# Patient Record
Sex: Female | Born: 1980 | Hispanic: No | Marital: Married | Smoking: Never smoker
Health system: Southern US, Community
[De-identification: ages and names within clinical notes are randomized; demographics above are authoritative.]

## PROBLEM LIST (undated history)

## (undated) DIAGNOSIS — O24419 Gestational diabetes mellitus in pregnancy, unspecified control: Secondary | ICD-10-CM

## (undated) HISTORY — DX: Gestational diabetes mellitus in pregnancy, unspecified control: O24.419

## (undated) HISTORY — PX: DILATION AND CURETTAGE OF UTERUS: SHX78

---

## 2012-07-13 ENCOUNTER — Emergency Department: Payer: Self-pay | Admitting: Emergency Medicine

## 2012-07-13 LAB — CBC
HCT: 44.5 % (ref 35.0–47.0)
HGB: 14.1 g/dL (ref 12.0–16.0)
MCH: 28.2 pg (ref 26.0–34.0)
MCHC: 31.6 g/dL — ABNORMAL LOW (ref 32.0–36.0)
MCV: 89 fL (ref 80–100)
Platelet: 241 10*3/uL (ref 150–440)
RBC: 4.99 10*6/uL (ref 3.80–5.20)
RDW: 12.9 % (ref 11.5–14.5)
WBC: 11.6 10*3/uL — ABNORMAL HIGH (ref 3.6–11.0)

## 2012-07-13 LAB — URINALYSIS, COMPLETE
Bilirubin,UR: NEGATIVE
Glucose,UR: NEGATIVE mg/dL (ref 0–75)
Ketone: NEGATIVE
Leukocyte Esterase: NEGATIVE
Nitrite: NEGATIVE
Ph: 6 (ref 4.5–8.0)
Protein: NEGATIVE
RBC,UR: <1 /HPF (ref 0–5)
Specific Gravity: 1.002 (ref 1.003–1.030)
Squamous Epithelial: <1
WBC UR: <1 /HPF (ref 0–5)

## 2012-07-13 LAB — HCG, QUANTITATIVE, PREGNANCY: Beta Hcg, Quant.: 480 m[IU]/mL — ABNORMAL HIGH

## 2013-06-15 LAB — OB RESULTS CONSOLE ANTIBODY SCREEN: Antibody Screen: NEGATIVE

## 2013-06-15 LAB — OB RESULTS CONSOLE ABO/RH: RH Type: POSITIVE

## 2013-06-15 LAB — OB RESULTS CONSOLE RUBELLA ANTIBODY, IGM: Rubella: IMMUNE

## 2013-06-15 LAB — OB RESULTS CONSOLE RPR: RPR: NONREACTIVE

## 2013-06-15 LAB — OB RESULTS CONSOLE HEPATITIS B SURFACE ANTIGEN: Hepatitis B Surface Ag: NEGATIVE

## 2013-06-15 LAB — OB RESULTS CONSOLE HIV ANTIBODY (ROUTINE TESTING): HIV: NONREACTIVE

## 2013-06-24 LAB — OB RESULTS CONSOLE GC/CHLAMYDIA
Chlamydia: NEGATIVE
Gonorrhea: NEGATIVE

## 2013-07-13 ENCOUNTER — Inpatient Hospital Stay (HOSPITAL_COMMUNITY): Admission: AD | Admit: 2013-07-13 | Payer: Self-pay | Source: Ambulatory Visit | Admitting: Obstetrics & Gynecology

## 2013-11-02 ENCOUNTER — Encounter: Payer: 59 | Attending: Obstetrics & Gynecology

## 2013-11-07 NOTE — Progress Notes (Signed)
  Patient was seen on 11/04/13 for Gestational Diabetes self-management class at the [facility]. The following learning objectives were met by the patient during this course:   States the definition of Gestational Diabetes  States why dietary management is important in controlling blood glucose  Describes the effects of carbohydrates on blood glucose levels  Demonstrates ability to create a balanced meal plan  Demonstrates carbohydrate counting   States when to check blood glucose levels  Demonstrates proper blood glucose monitoring techniques  States the effect of stress and exercise on blood glucose levels  States the importance of limiting caffeine and abstaining from alcohol and smoking  Plan:  Aim for 2 Carb Choices per meal (30 grams) +/- 1 either way for breakfast Aim for 3 Carb Choices per meal (45 grams) +/- 1 either way from lunch and dinner Aim for 1-2 Carbs per snack Begin reading food labels for Total Carbohydrate and sugar grams of foods Consider  increasing your activity level by walking daily as tolerated Begin checking BG before breakfast and 1-2 hours after first bit of breakfast, lunch and dinner after  as directed by MD  Take medication  as directed by MD  Blood glucose monitor given: Accu Chek Nano BG Monitoring Kit Lot # [id number] Exp: 11/27/13 Blood glucose reading: 82  Patient instructed to monitor glucose levels: FBS: 60 - <90 1 hour: <140 2 hour: <120  Patient received the following handouts:  Nutrition Diabetes and Pregnancy  Carbohydrate Counting List  Meal Planning worksheet  Patient will be seen for follow-up as needed.

## 2013-12-26 LAB — OB RESULTS CONSOLE GBS: GBS: NEGATIVE

## 2013-12-29 NOTE — L&D Delivery Note (Signed)
Delivery Note At 1:29 PM a viable and healthy female was delivered via Vaginal, Spontaneous Delivery (Presentation: Left Occiput Anterior).  APGAR: 9, 9; weight .pending Placenta status: Intact, Spontaneous.  Cord: 3 vessels with the following complications: None.  Cord pH: none  Anesthesia: Epidural  Episiotomy: None Lacerations: 2nd degree Suture Repair: 3.0 vicryl rapide Est. Blood Loss (mL): 400 cc  Mom to postpartum.  Baby to Couplet care / Skin to Skin.  MODY,Teresa Salinas 01/02/2014, 1:52 PM

## 2014-01-02 ENCOUNTER — Inpatient Hospital Stay (HOSPITAL_COMMUNITY)
Admission: AD | Admit: 2014-01-02 | Discharge: 2014-01-04 | DRG: 775 | Disposition: A | Discharge status: Home / Self Care | Payer: 59 | Source: Ambulatory Visit | Attending: Obstetrics & Gynecology | Admitting: Obstetrics & Gynecology

## 2014-01-02 ENCOUNTER — Inpatient Hospital Stay (HOSPITAL_COMMUNITY): Payer: 59 | Admitting: Anesthesiology

## 2014-01-02 ENCOUNTER — Encounter (HOSPITAL_COMMUNITY): Payer: Self-pay | Admitting: *Deleted

## 2014-01-02 ENCOUNTER — Encounter (HOSPITAL_COMMUNITY): Payer: 59 | Admitting: Anesthesiology

## 2014-01-02 DIAGNOSIS — O24419 Gestational diabetes mellitus in pregnancy, unspecified control: Secondary | ICD-10-CM

## 2014-01-02 DIAGNOSIS — O99814 Abnormal glucose complicating childbirth: Principal | ICD-10-CM | POA: Diagnosis present

## 2014-01-02 HISTORY — DX: Gestational diabetes mellitus in pregnancy, unspecified control: O24.419

## 2014-01-02 LAB — CBC
HCT: 40.2 % (ref 36.0–46.0)
Hemoglobin: 13.6 g/dL (ref 12.0–15.0)
MCH: 27.4 pg (ref 26.0–34.0)
MCHC: 33.8 g/dL (ref 30.0–36.0)
MCV: 81 fL (ref 78.0–100.0)
Platelets: 187 10*3/uL (ref 150–400)
RBC: 4.96 MIL/uL (ref 3.87–5.11)
RDW: 14.9 % (ref 11.5–15.5)
WBC: 9.9 10*3/uL (ref 4.0–10.5)

## 2014-01-02 LAB — GLUCOSE, CAPILLARY: Glucose-Capillary: 76 mg/dL (ref 70–99)

## 2014-01-02 LAB — POCT FERN TEST
POCT Fern Test: NEGATIVE
POCT Fern Test: NEGATIVE

## 2014-01-02 LAB — RPR: RPR Ser Ql: NONREACTIVE

## 2014-01-02 LAB — AMNISURE RUPTURE OF MEMBRANE (ROM) NOT AT ARMC: Amnisure ROM: POSITIVE

## 2014-01-02 MED ORDER — ONDANSETRON HCL 4 MG/2ML IJ SOLN: 4.0000 mg | Freq: Four times a day (QID) | INTRAMUSCULAR | Status: DC | PRN

## 2014-01-02 MED ORDER — LACTATED RINGERS IV SOLN
INTRAVENOUS | Status: DC
  Administered 2014-01-02: 10:00:00 via INTRAVENOUS

## 2014-01-02 MED ORDER — PHENYLEPHRINE 40 MCG/ML (10ML) SYRINGE FOR IV PUSH (FOR BLOOD PRESSURE SUPPORT)
80.0000 ug | PREFILLED_SYRINGE | INTRAVENOUS | Status: DC | PRN
  Filled 2014-01-02: qty 2
  Filled 2014-01-02: qty 10

## 2014-01-02 MED ORDER — OXYTOCIN BOLUS FROM INFUSION: 500.0000 mL | INTRAVENOUS | Status: DC

## 2014-01-02 MED ORDER — OXYCODONE-ACETAMINOPHEN 5-325 MG PO TABS: 1.0000 | ORAL_TABLET | ORAL | Status: DC | PRN

## 2014-01-02 MED ORDER — DIBUCAINE 1 % RE OINT
1.0000 "application " | TOPICAL_OINTMENT | RECTAL | Status: DC | PRN
  Administered 2014-01-02: 1 via RECTAL
  Filled 2014-01-02: qty 28

## 2014-01-02 MED ORDER — DIPHENHYDRAMINE HCL 25 MG PO CAPS: 25.0000 mg | ORAL_CAPSULE | Freq: Four times a day (QID) | ORAL | Status: DC | PRN

## 2014-01-02 MED ORDER — BENZOCAINE-MENTHOL 20-0.5 % EX AERO
1.0000 "application " | INHALATION_SPRAY | CUTANEOUS | Status: DC | PRN
  Administered 2014-01-02: 1 via TOPICAL
  Filled 2014-01-02: qty 56

## 2014-01-02 MED ORDER — IBUPROFEN 600 MG PO TABS: 600.0000 mg | ORAL_TABLET | Freq: Four times a day (QID) | ORAL | Status: DC | PRN

## 2014-01-02 MED ORDER — EPHEDRINE 5 MG/ML INJ
10.0000 mg | INTRAVENOUS | Status: DC | PRN
  Filled 2014-01-02: qty 2
  Filled 2014-01-02: qty 4

## 2014-01-02 MED ORDER — ZOLPIDEM TARTRATE 5 MG PO TABS: 5.0000 mg | ORAL_TABLET | Freq: Every evening | ORAL | Status: DC | PRN

## 2014-01-02 MED ORDER — LIDOCAINE HCL (PF) 1 % IJ SOLN
30.0000 mL | INTRAMUSCULAR | Status: DC | PRN
  Filled 2014-01-02 (×2): qty 30

## 2014-01-02 MED ORDER — EPHEDRINE 5 MG/ML INJ
10.0000 mg | INTRAVENOUS | Status: DC | PRN
  Filled 2014-01-02: qty 2

## 2014-01-02 MED ORDER — ONDANSETRON HCL 4 MG/2ML IJ SOLN: 4.0000 mg | INTRAMUSCULAR | Status: DC | PRN

## 2014-01-02 MED ORDER — SENNOSIDES-DOCUSATE SODIUM 8.6-50 MG PO TABS
2.0000 | ORAL_TABLET | ORAL | Status: DC
  Administered 2014-01-03: 2 via ORAL
  Filled 2014-01-02 (×2): qty 2

## 2014-01-02 MED ORDER — TERBUTALINE SULFATE 1 MG/ML IJ SOLN: 0.2500 mg | Freq: Once | INTRAMUSCULAR | Status: DC | PRN

## 2014-01-02 MED ORDER — DIPHENHYDRAMINE HCL 50 MG/ML IJ SOLN: 12.5000 mg | INTRAMUSCULAR | Status: DC | PRN

## 2014-01-02 MED ORDER — ACETAMINOPHEN 325 MG PO TABS: 650.0000 mg | ORAL_TABLET | ORAL | Status: DC | PRN

## 2014-01-02 MED ORDER — CITRIC ACID-SODIUM CITRATE 334-500 MG/5ML PO SOLN: 30.0000 mL | ORAL | Status: DC | PRN

## 2014-01-02 MED ORDER — OXYTOCIN 40 UNITS IN LACTATED RINGERS INFUSION - SIMPLE MED
62.5000 mL/h | INTRAVENOUS | Status: DC
  Administered 2014-01-02: 62.5 mL/h via INTRAVENOUS

## 2014-01-02 MED ORDER — LACTATED RINGERS IV SOLN: 500.0000 mL | INTRAVENOUS | Status: DC | PRN

## 2014-01-02 MED ORDER — BUTORPHANOL TARTRATE 1 MG/ML IJ SOLN: 1.0000 mg | INTRAMUSCULAR | Status: DC | PRN

## 2014-01-02 MED ORDER — LANOLIN HYDROUS EX OINT: TOPICAL_OINTMENT | CUTANEOUS | Status: DC | PRN

## 2014-01-02 MED ORDER — IBUPROFEN 600 MG PO TABS
600.0000 mg | ORAL_TABLET | Freq: Four times a day (QID) | ORAL | Status: DC
  Administered 2014-01-02 – 2014-01-04 (×7): 600 mg via ORAL
  Filled 2014-01-02 (×8): qty 1

## 2014-01-02 MED ORDER — FLEET ENEMA 7-19 GM/118ML RE ENEM: 1.0000 | ENEMA | RECTAL | Status: DC | PRN

## 2014-01-02 MED ORDER — PRENATAL MULTIVITAMIN CH
1.0000 | ORAL_TABLET | Freq: Every day | ORAL | Status: DC
  Administered 2014-01-03 – 2014-01-04 (×2): 1 via ORAL
  Filled 2014-01-02 (×2): qty 1

## 2014-01-02 MED ORDER — LACTATED RINGERS IV SOLN: 500.0000 mL | Freq: Once | INTRAVENOUS | Status: DC

## 2014-01-02 MED ORDER — FENTANYL 2.5 MCG/ML BUPIVACAINE 1/10 % EPIDURAL INFUSION (WH - ANES)
14.0000 mL/h | INTRAMUSCULAR | Status: DC | PRN
  Filled 2014-01-02: qty 125

## 2014-01-02 MED ORDER — FENTANYL 2.5 MCG/ML BUPIVACAINE 1/10 % EPIDURAL INFUSION (WH - ANES)
INTRAMUSCULAR | Status: DC | PRN
  Administered 2014-01-02: 14 mL/h via EPIDURAL

## 2014-01-02 MED ORDER — ONDANSETRON HCL 4 MG PO TABS: 4.0000 mg | ORAL_TABLET | ORAL | Status: DC | PRN

## 2014-01-02 MED ORDER — OXYTOCIN 40 UNITS IN LACTATED RINGERS INFUSION - SIMPLE MED
1.0000 m[IU]/min | INTRAVENOUS | Status: DC
  Administered 2014-01-02: 2 m[IU]/min via INTRAVENOUS
  Filled 2014-01-02: qty 1000

## 2014-01-02 MED ORDER — LIDOCAINE HCL (PF) 1 % IJ SOLN
INTRAMUSCULAR | Status: DC | PRN
  Administered 2014-01-02 (×2): 9 mL

## 2014-01-02 MED ORDER — TETANUS-DIPHTH-ACELL PERTUSSIS 5-2.5-18.5 LF-MCG/0.5 IM SUSP: 0.5000 mL | Freq: Once | INTRAMUSCULAR | Status: DC

## 2014-01-02 MED ORDER — PHENYLEPHRINE 40 MCG/ML (10ML) SYRINGE FOR IV PUSH (FOR BLOOD PRESSURE SUPPORT)
80.0000 ug | PREFILLED_SYRINGE | INTRAVENOUS | Status: DC | PRN
  Filled 2014-01-02: qty 2

## 2014-01-02 MED ORDER — SIMETHICONE 80 MG PO CHEW: 80.0000 mg | CHEWABLE_TABLET | ORAL | Status: DC | PRN

## 2014-01-02 MED ORDER — WITCH HAZEL-GLYCERIN EX PADS: 1.0000 "application " | MEDICATED_PAD | CUTANEOUS | Status: DC | PRN

## 2014-01-02 NOTE — Anesthesia Postprocedure Evaluation (Signed)
Anesthesia Post Note  Patient: [name] [name]   Procedure(s) Performed: CLE/C/S  Anesthesia type: Epidural  Patient location: Mother/Baby  Post pain: Pain level controlled  Post assessment: Post-op Vital signs reviewed  Last Vitals: BP 117/68  Pulse 104  Temp(Src) 36.8 C (Oral)  Resp 20  Ht 5\' 4"  (1.626 m)  Wt 167 lb 12.8 oz (76.114 kg)  BMI 28.79 kg/m2  SpO2 97%  Post vital signs: Reviewed  Level of consciousness: awake  Complications: No apparent anesthesia complications

## 2014-01-02 NOTE — MAU Note (Signed)
Pt reports leaking clear fluid since 0430.

## 2014-01-02 NOTE — H&P (Signed)
Teresa Salinas is a 33 y.o. female presenting at 37.2 wks with leaking clear fluid since 4 am and uterine contractions. Amnisure (+). Good FMs, no bleeding.   [facility], primary Dr Teresa Salinas. 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  A2GDM with good control with Glyburide 2.5 mg with breakfast. Fetal growth AGA with 34 wks sono at 5 lbs, 43%, nl AC and AFI, Vtx. Wkly ANtesting from 34 wks with reactive NSTs. GBS(-) per office chart (12/28/13).   Maternal Medical History:  Reason for admission: Rupture of membranes.     OB History   Grav Para Term Preterm Abortions TAB SAB Ect Mult Living   2 0   1  1        Past Medical History  Diagnosis Date  . Gestational diabetes mellitus, antepartum    Past Surgical History  Procedure Laterality Date  . Dilation and curettage of uterus     Family History: family history is not on file. Social History:  reports that she has never smoked. She has never used smokeless tobacco. She reports that she does not drink alcohol or use illicit drugs.   Prenatal Transfer Tool  Maternal Diabetes: Yes:  Diabetes Type:  Insulin/Medication controlled- Glyburide 2.5 mg daily AM Genetic Screening: Normal Ultrascreen neg, AFP1 normal Maternal Ultrasounds/Referrals: Normal Fetal Ultrasounds or other Referrals:  None Maternal Substance Abuse:  No Significant Maternal Medications:  Meds include: Other:  Prometrium and ASA in 1st trim. Glyburide from 3rd trimester, 2.5mg  AM. Significant Maternal Lab Results:  Lab values include: Group B Strep negative Other Comments:  None  ROS  Dilation: 2.5 Effacement (%): 70 Station: -1 Exam by:: Teresa Simpson RN Blood pressure 117/76, pulse 90, temperature 98 F (36.7 C), temperature source Oral, resp. rate 18, height 5\' 4"  (1.626 m), weight 167 lb 12.8 oz (76.114 kg). Exam Physical Exam  A&O x 3, no acute distress. Pleasant HEENT  neg Lungs CTA bilat CV RRR, S1S2 normal Abdo soft, non tender, non acute Extr no edema/ tenderness Pelvic as above FHT  140s/ + accels, no decels, mod variab- category I Toco q 4-6 min  Prenatal labs: ABO, Rh: B/Positive/-- (06/18 0000) Antibody: Negative (06/18 0000) Rubella: Immune (06/18 0000) RPR: Nonreactive (06/18 0000)  HBsAg: Negative (06/18 0000)  HIV: Non-reactive (06/18 0000)  GBS:   Negative  GLucola abn- has GDM Anatomy and growth sono normal  Assessment/Plan: 33 yo G2P0010 at 37.2 wks with A2-GDM on Glyburide with ruptured membranes since 4 am and GBS neg. Vtx. Admit, augment as needed with pitocin, pain meds/epidural reviewed. FHT category I.   MODY,Teresa Salinas 01/02/2014, 7:29 AM

## 2014-01-02 NOTE — Progress Notes (Signed)
Teresa Salinas is a 33 y.o. G2P0010 at 2355w2d, feels some rectal pressure.  Objective: BP 124/74  Pulse 97  Temp(Src) 98.2 F (36.8 C) (Oral)  Resp 18  Ht 5\' 4"  (1.626 m)  Wt 167 lb 12.8 oz (76.114 kg)  BMI 28.79 kg/m2  SpO2 100%   Total I/O In: -  Out: 400 [Urine:400]  FHT:  FHR: 120 bpm, variability: minimal ,  accelerations:  Present,  decelerations:  Absent UC:   Every 3 min SVE:   Dilation: 10 Effacement (%): 70 Station: +2 Exam by:: Teresa Salinas  Assessment / Plan: Augmentation of labor, progressing well  Labor: Progressing normally Preeclampsia:  none Fetal Wellbeing:  Category I Pain Control:  Epidural I/D:  n/a Anticipated MOD:  NSVD  Teresa Salinas,Teresa Salinas 01/02/2014, 1:09 PM

## 2014-01-02 NOTE — Lactation Note (Signed)
This note was copied from the chart of Teresa Zakyria Pantoja. Lactation Consultation Note  Patient Name: Teresa Janace HoardSumalatha Hosmer ZOXWR'UToday's Date: 01/02/2014 Reason for consult: Initial assessment;Difficult latch;Infant < 6lbs;Late preterm infant.  RN, Vernona RiegerLaura had reported a difficult latch with this primipara and her preterm (37 week) newborn at 88 hours of age.  LC arrived to find baby asleep in crib and with some saliva and mucus inside her mouth.  Mom states she nursed baby about 4 hours ago and her nurse had reported some hand expression of small amounts of colostrum earlier today.  At this attempt, LC suggested sitting football position and baby was briefly responsive to gentle massage but quickly fell asleep and did not latch, even when #20 NS was tried.  Mom has very small/short nipples which could make it difficult for baby to obtain a deep latch.  Use of NS reviewed with parents and STS and cue feedings at least every 3 hours recommended due to baby's small size and borderline premie status.  LC encouraged review of Baby and Me pp 9, 14 and 20-25 for STS and BF information. LC provided [facility] Resource brochure and reviewed [facility] services and list of community and web site resources.  Report of LC latch attempt given to night shift RN, Sheralyn Boatmanoni.    Maternal Data Formula Feeding for Exclusion: No Infant to breast within first hour of birth: Yes (initial LATCH score=7 and nursed for 20 minutes) Has patient been taught Hand Expression?: Yes (RN and LC both assisted and demonstrated hand expression; colostrum easily expressed) Does the patient have breastfeeding experience prior to this delivery?: No  Feeding Feeding Type: Breast Fed Length of feed: 60 min  LATCH Score/Interventions Latch: Too sleepy or reluctant, no latch achieved, no sucking elicited. Intervention(s): Skin to skin;Teach feeding cues;Waking techniques (demonstrated gentle massage and baby responsive but fell asleep)  Audible Swallowing:  None Intervention(s): Skin to skin;Hand expression  Type of Nipple: Everted at rest and after stimulation (nipples small and very short and baby reluctant to open mouth wide)  Comfort (Breast/Nipple): Soft / non-tender     Hold (Positioning): Assistance needed to correctly position infant at breast and maintain latch. Intervention(s): Breastfeeding basics reviewed;Support Pillows;Position options;Skin to skin  LATCH Score: 5 (too sleepy to open mouth wide and latch)  Lactation Tools Discussed/Used Tools: Nipple Shields Nipple shield size: 20 (tried without success)   Consult Status Consult Status: Follow-up Date: 01/03/14 Follow-up type: In-patient    Warrick ParisianBryant, Joanne [facility] 01/02/2014, 10:15 PM

## 2014-01-02 NOTE — Anesthesia Procedure Notes (Signed)
Epidural Patient location during procedure: OB Start time: 01/02/2014 8:36 AM End time: 01/02/2014 8:40 AM  Staffing Anesthesiologist: Leilani AbleHATCHETT, FRANKLIN Performed by: anesthesiologist   Preanesthetic Checklist Completed: patient identified, surgical consent, pre-op evaluation, timeout performed, IV checked, risks and benefits discussed and monitors and equipment checked  Epidural Patient position: sitting Prep: site prepped and draped and DuraPrep Patient monitoring: continuous pulse ox and blood pressure Approach: midline Injection technique: LOR air  Needle:  Needle type: Tuohy  Needle gauge: 17 G Needle length: 9 cm and 9 Needle insertion depth: 6 cm Catheter type: closed end flexible Catheter size: 19 Gauge Catheter at skin depth: 11 cm Test dose: negative and Other  Assessment Sensory level: T9 Events: blood not aspirated, injection not painful, no injection resistance, negative IV test and no paresthesia  Additional Notes Reason for block:procedure for pain

## 2014-01-02 NOTE — Anesthesia Preprocedure Evaluation (Signed)
Anesthesia Evaluation  Patient identified by MRN, date of birth, ID band Patient awake    Reviewed: Allergy & Precautions, H&P , NPO status , Patient's Chart, lab work & pertinent test results  Airway Mallampati: II TM Distance: >3 FB Neck ROM: full    Dental no notable dental hx.    Pulmonary neg pulmonary ROS,  breath sounds clear to auscultation  Pulmonary exam normal       Cardiovascular negative cardio ROS      Neuro/Psych negative neurological ROS  negative psych ROS   GI/Hepatic negative GI ROS, Neg liver ROS,   Endo/Other  diabetes, Gestational, Oral Hypoglycemic Agents  Renal/GU negative Renal ROS  negative genitourinary   Musculoskeletal negative musculoskeletal ROS (+)   Abdominal Normal abdominal exam  (+)   Peds  Hematology negative hematology ROS (+)   Anesthesia Other Findings   Reproductive/Obstetrics (+) Pregnancy                           Anesthesia Physical Anesthesia Plan  ASA: II  Anesthesia Plan: Epidural   Post-op Pain Management:    Induction:   Airway Management Planned:   Additional Equipment:   Intra-op Plan:   Post-operative Plan:   Informed Consent: I have reviewed the patients History and Physical, chart, labs and discussed the procedure including the risks, benefits and alternatives for the proposed anesthesia with the patient or authorized representative who has indicated his/her understanding and acceptance.     Plan Discussed with:   Anesthesia Plan Comments:         Anesthesia Quick Evaluation

## 2014-01-02 NOTE — Progress Notes (Signed)
Teresa Salinas is a 33 y.o. G2P0010 at 5747w2d by 1st trim sono. Admitted with SROM, progressed well with pitocin after epidural. No complaints.  Objective: BP 113/73  Pulse 78  Temp(Src) 98 F (36.7 C) (Oral)  Resp 18  Ht 5\' 4"  (1.626 m)  Wt 167 lb 12.8 oz (76.114 kg)  BMI 28.79 kg/m2  SpO2 100%   Total I/O In: -  Out: 400 [Urine:400]  FHT:  FHR: 135 bpm, variability: moderate,  accelerations:  Present,  decelerations:  Absent UC:   regular, every 3 minutes SVE:   Dilation: Lip/rim Effacement (%): 70 Station: 0 Exam by:: Teresa Salinas Turn in exaggerated Sims  Assessment / Plan: Augmentation of labor, progressing well  Labor: Progressing normally Fetal Wellbeing:  Category I Pain Control:  Epidural Anticipated MOD:  NSVD, EFW 5.1/2 - 6 lbs. OP, turn patient to favor rotation  Teresa Salinas,Teresa Salinas 01/02/2014, 12:18 PM

## 2014-01-03 ENCOUNTER — Encounter (HOSPITAL_COMMUNITY): Payer: Self-pay | Admitting: *Deleted

## 2014-01-03 LAB — CBC
HCT: 36 % (ref 36.0–46.0)
Hemoglobin: 11.9 g/dL — ABNORMAL LOW (ref 12.0–15.0)
MCH: 26.9 pg (ref 26.0–34.0)
MCHC: 33.1 g/dL (ref 30.0–36.0)
MCV: 81.4 fL (ref 78.0–100.0)
Platelets: 187 10*3/uL (ref 150–400)
RBC: 4.42 MIL/uL (ref 3.87–5.11)
RDW: 15.1 % (ref 11.5–15.5)
WBC: 13 10*3/uL — ABNORMAL HIGH (ref 4.0–10.5)

## 2014-01-03 NOTE — Progress Notes (Signed)
Patient ID: Janace HoardSumalatha Patchin, female   DOB: 09/25/1981, 33 y.o.   MRN: [id number] PPD # 1 SVD  S:  Reports feeling well             Tolerating po/ No nausea or vomiting             Bleeding is light             Pain controlled with ibuprofen (OTC)             Up ad lib / ambulatory / voiding without difficulties    Newborn  Information for the patient's newborn:  Idelle LeechKotla, Girl Ambria [[id number]][[id number]]  female  breast feeding     O:  A & O x 3, in no apparent distress              VS:  Filed Vitals:   01/02/14 1630 01/02/14 1847 01/03/14 0230 01/03/14 0832  BP: 111/70 117/68 99/67 106/68  Pulse: 105 104 97 84  Temp: 98.2 F (36.8 C) 98.2 F (36.8 C) 97.7 F (36.5 C) 98.3 F (36.8 C)  TempSrc: Oral Oral Oral Oral  Resp: 20 20 20 18   Height:      Weight:      SpO2: 98% 97% 98%     LABS:  Recent Labs  01/02/14 0650 01/03/14 0600  WBC 9.9 13.0*  HGB 13.6 11.9*  HCT 40.2 36.0  PLT 187 187    Blood type: B/Positive/-- (06/18 0000)  Rubella: Immune (06/18 0000)   I&O: I/O last 3 completed shifts: In: -  Out: 800 [Urine:400; Blood:400]             Lungs: Clear and unlabored  Heart: regular rate and rhythm / no murmurs  Abdomen: soft, non-tender, non-distended              Fundus: firm, non-tender, U-1  Perineum: 2nd degree repair healing well  Lochia: minimal  Extremities: trace edema, no calf pain or tenderness, no Homans    A/P: PPD # 1  33 y.o., [id number]   Principal Problem:   Postpartum care following vaginal delivery (1/5) Active Problems:   Indication for care in labor or delivery   GDM, class A2   Doing well - stable status  Routine post partum orders  Anticipate discharge tomorrow    Raelyn MoraAWSON, ROLITTA, M, MSN, CNM 01/03/2014, 10:17 AM

## 2014-01-04 MED ORDER — IBUPROFEN 600 MG PO TABS: 600.0000 mg | ORAL_TABLET | Freq: Four times a day (QID) | ORAL | Status: DC

## 2014-01-04 MED ORDER — OXYCODONE-ACETAMINOPHEN 5-325 MG PO TABS: 1.0000 | ORAL_TABLET | ORAL | Status: DC | PRN

## 2014-01-04 NOTE — Progress Notes (Signed)
Ur chart review completed per request.  

## 2014-01-04 NOTE — Discharge Summary (Signed)
Obstetric Discharge Summary  Reason for Admission: onset of labor- SROM / GDMa2 Prenatal Procedures: NST and ultrasound Intrapartum Procedures: pitocin augmentation / spontaneous vaginal delivery and epidural Postpartum Procedures: none Complications-Operative and Postpartum: 2nd degree perineal laceration Hemoglobin  Date Value Range Status  01/03/2014 11.9* 12.0 - 15.0 g/dL Final     HCT  Date Value Range Status  01/03/2014 36.0  36.0 - 46.0 % Final    Physical Exam:  General: alert, cooperative and no distress Lochia: appropriate Uterine Fundus: firm Incision: healing well DVT Evaluation: No evidence of DVT seen on physical exam.  Discharge Diagnoses: Term Pregnancy-delivered / [id number] -delivered  Discharge Information: Date: 01/04/2014 Activity: pelvic rest Diet: Low carb Medications: PNV, Ibuprofen and Percocet Condition: stable Instructions: refer to practice specific booklet Discharge to: home Follow-up Information   Follow up with MODY,[name], MD. Schedule an appointment as soon as possible for a visit in 6 weeks.   Specialty:  Obstetrics and Gynecology   Contact information:   [address] [place] [id number] [phone]      Sugar test for diabetes in 6-12 weeks  Newborn Data: Live born female  Birth Weight: 5 lb 5.8 oz (2432 g) APGAR: 9, 9  Home with mother.  Marlinda MikeBAILEY, TANYA 01/04/2014, 9:45 AM

## 2014-01-04 NOTE — Discharge Summary (Signed)
Reviewed and agree with note and plan. V.Mody, MD  

## 2014-01-04 NOTE — Progress Notes (Signed)
PPD 2 SVD  S:  Reports feeling Well             Tolerating po/ No nausea or vomiting             Bleeding is light             Pain controlled with motrin and percocet             Up ad lib / ambulatory / voiding QS  Newborn breast feeding  O:               VS: BP 123/77  Pulse 85  Temp(Src) 97.9 F (36.6 C) (Oral)  Resp 18  Ht 5\' 4"  (1.626 m)  Wt 76.114 kg (167 lb 12.8 oz)  BMI 28.79 kg/m2  SpO2 98%   LABS:              Recent Labs  01/02/14 0650 01/03/14 0600  WBC 9.9 13.0*  HGB 13.6 11.9*  PLT 187 187               Blood type: B/Positive/-- (06/18 0000)  Rubella: Immune (06/18 0000)                     I&O: Intake/Output     01/06 0701 - 01/07 0700 01/07 0701 - 01/08 0700   Urine (mL/kg/hr)     Blood     Total Output       Net                          Physical Exam:             Alert and oriented X3  Lungs: Clear and unlabored  Heart: regular rate and rhythm / no mumurs  Abdomen: soft, non-tender, non-distended              Fundus: firm, non-tender, U-1  Perineum: mild edema  Lochia: light  Extremities: no edema, no calf pain or tenderness    A: PPD # 2              GDMa2  Doing well - stable status  P: Routine post partum orders  DC home             GTT 6-12 weeks  Marlinda MikeBAILEY, TANYA CNM, MSN, [facility] 01/04/2014, 9:43 AM

## 2014-10-30 ENCOUNTER — Encounter (HOSPITAL_COMMUNITY): Payer: Self-pay | Admitting: *Deleted

## 2019-04-04 ENCOUNTER — Other Ambulatory Visit: Payer: Self-pay | Admitting: Family Medicine

## 2019-04-04 DIAGNOSIS — R0789 Other chest pain: Secondary | ICD-10-CM

## 2019-04-06 ENCOUNTER — Ambulatory Visit
Admission: RE | Admit: 2019-04-06 | Discharge: 2019-04-06 | Disposition: A | Discharge status: Home / Self Care | Payer: 59 | Source: Ambulatory Visit | Attending: Family Medicine | Admitting: Family Medicine

## 2019-04-06 ENCOUNTER — Other Ambulatory Visit: Payer: Self-pay

## 2019-04-06 DIAGNOSIS — R0789 Other chest pain: Secondary | ICD-10-CM | POA: Diagnosis not present

## 2019-05-25 ENCOUNTER — Other Ambulatory Visit: Payer: Self-pay | Admitting: Family Medicine

## 2019-05-25 DIAGNOSIS — R0789 Other chest pain: Secondary | ICD-10-CM

## 2019-05-25 DIAGNOSIS — R9389 Abnormal findings on diagnostic imaging of other specified body structures: Secondary | ICD-10-CM

## 2019-07-04 ENCOUNTER — Ambulatory Visit
Admission: RE | Admit: 2019-07-04 | Discharge: 2019-07-04 | Disposition: A | Discharge status: Home / Self Care | Payer: 59 | Source: Ambulatory Visit | Attending: Family Medicine | Admitting: Family Medicine

## 2019-07-04 ENCOUNTER — Other Ambulatory Visit: Payer: Self-pay

## 2019-07-04 DIAGNOSIS — R9389 Abnormal findings on diagnostic imaging of other specified body structures: Secondary | ICD-10-CM | POA: Insufficient documentation

## 2019-07-04 DIAGNOSIS — R0789 Other chest pain: Secondary | ICD-10-CM | POA: Insufficient documentation

## 2020-02-27 IMAGING — CT CT CHEST WITHOUT CONTRAST
2 of 4 series · 15 of 36 positions shown, 18 images · non-contrast
Comparison: 04/06/2019

CLINICAL DATA: Follow-up pulmonary nodule

EXAM:
CT CHEST WITHOUT CONTRAST
TECHNIQUE: Multidetector CT imaging of the chest was performed following the
standard protocol without IV contrast.

[Series 2: axial chest · axial · 0.55mm/px · z∈[-1193,-931]mm · 12 of 155 slices shown, 15 images]
[im 12/155  mediastinal]
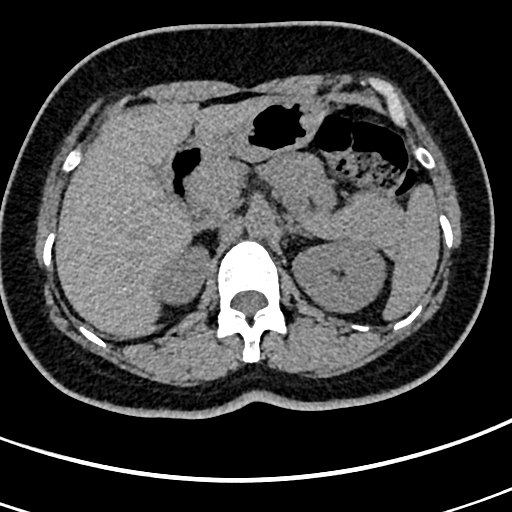
[im 12/155  lung]
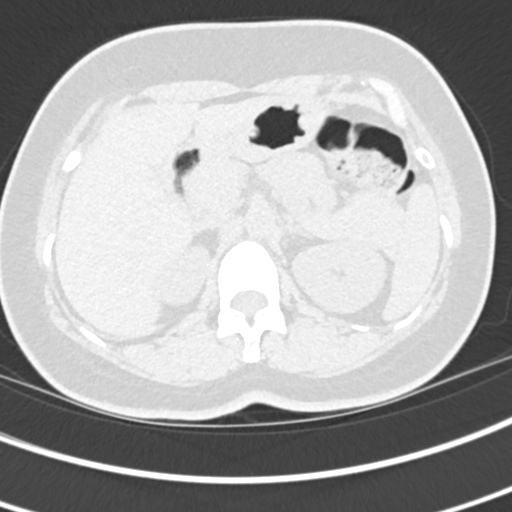
[im 24/155  lung]
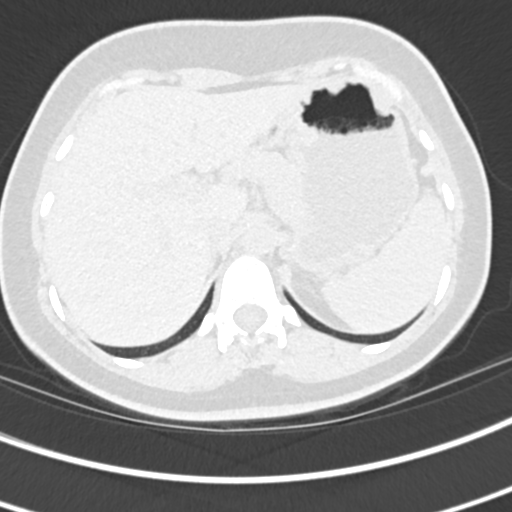
[im 36/155  lung]
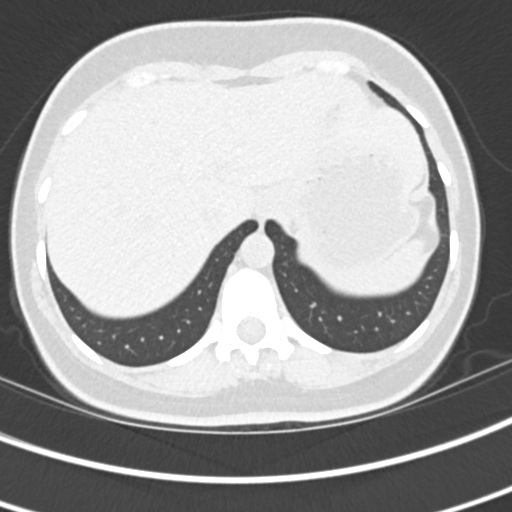
[im 48/155  lung]
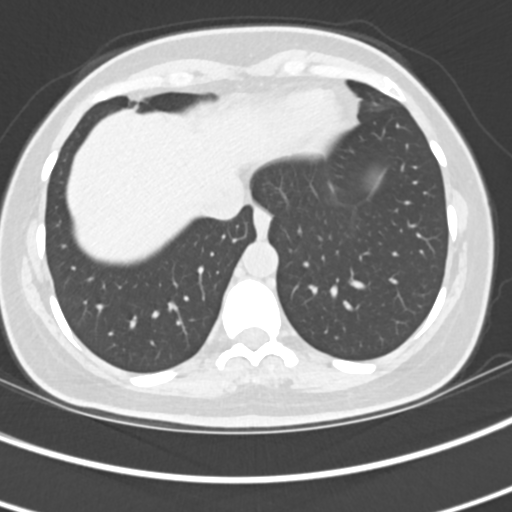
[im 60/155  mediastinal]
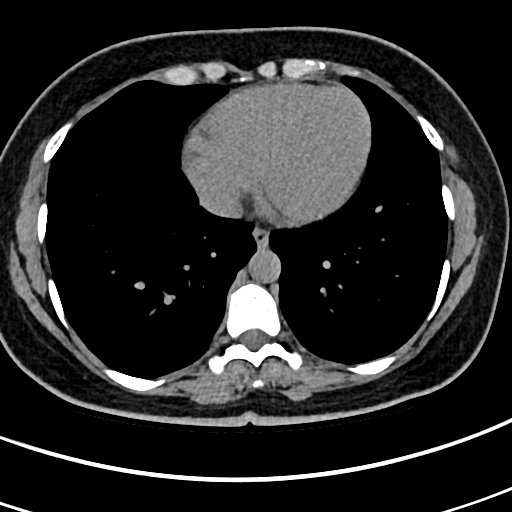
[im 60/155  lung]
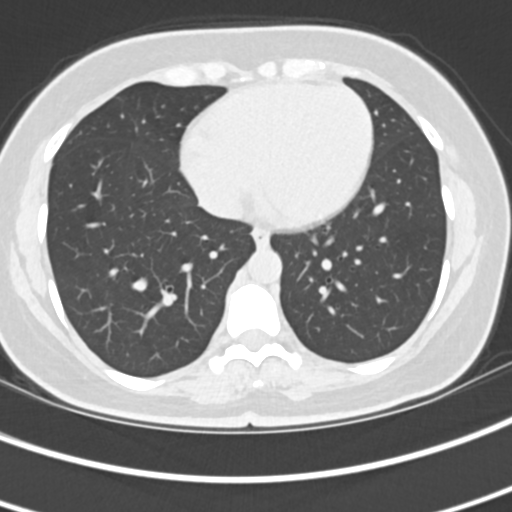
[im 72/155  lung]
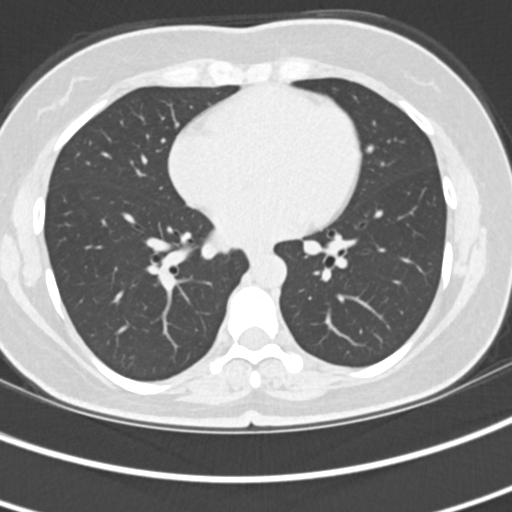
[im 83/155  lung]
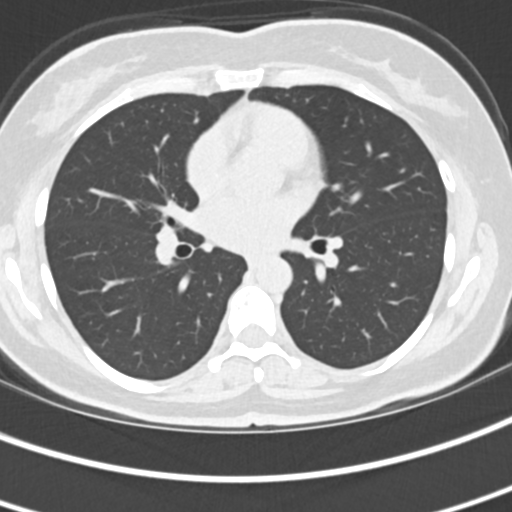
[im 95/155  lung]
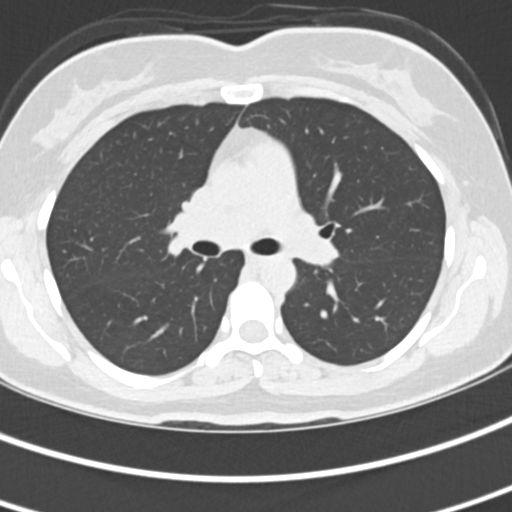
[im 107/155  mediastinal]
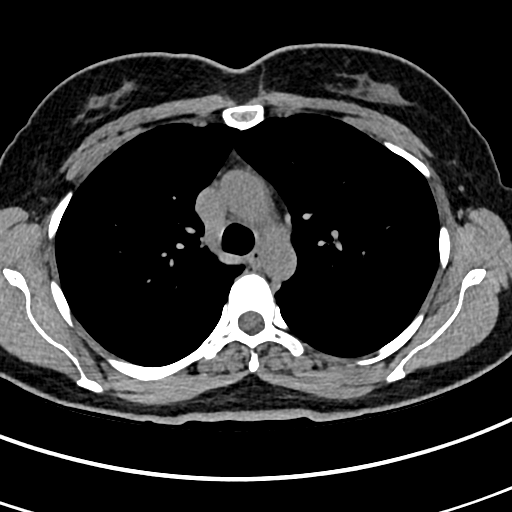
[im 107/155  lung]
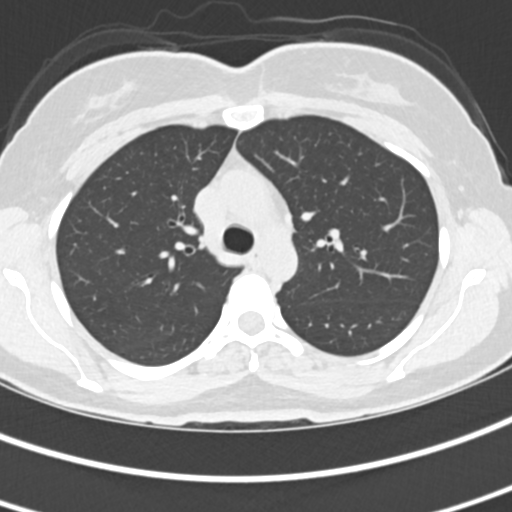
[im 119/155  lung]
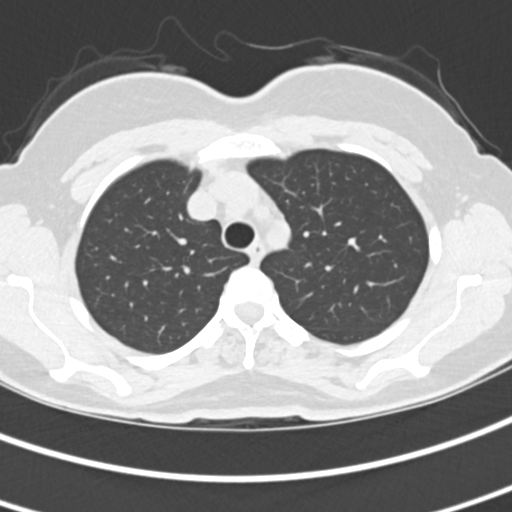
[im 131/155  lung]
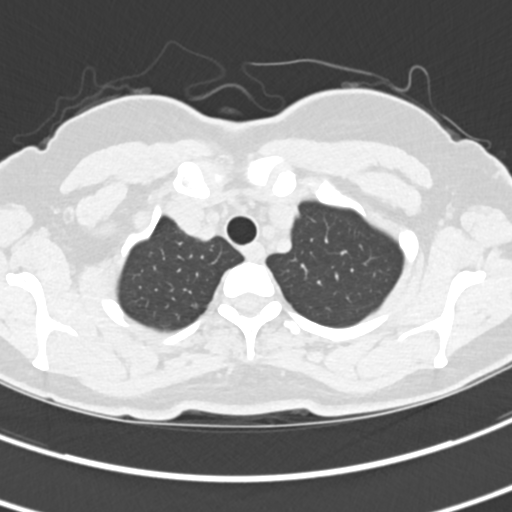
[im 143/155  lung]
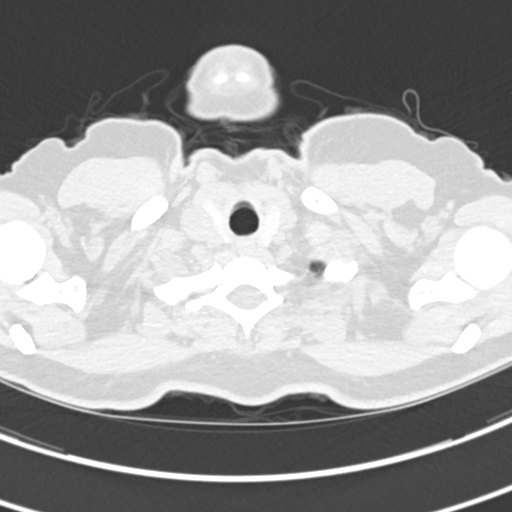

[Series 5: coronal chest · coronal · 0.55mm/px · 3 of 116 slices shown]
[im 24/116  lung]
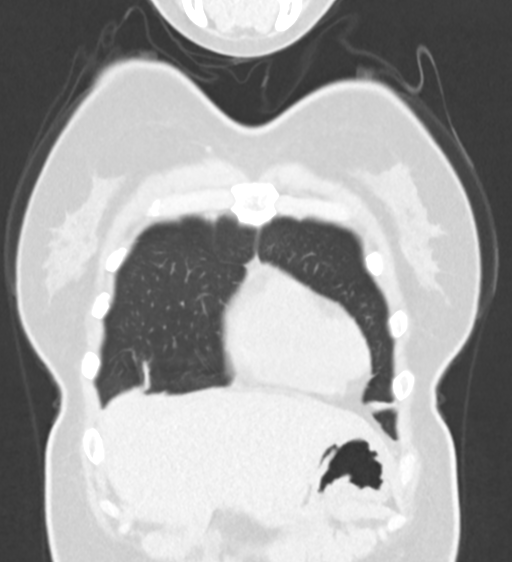
[im 47/116  lung]
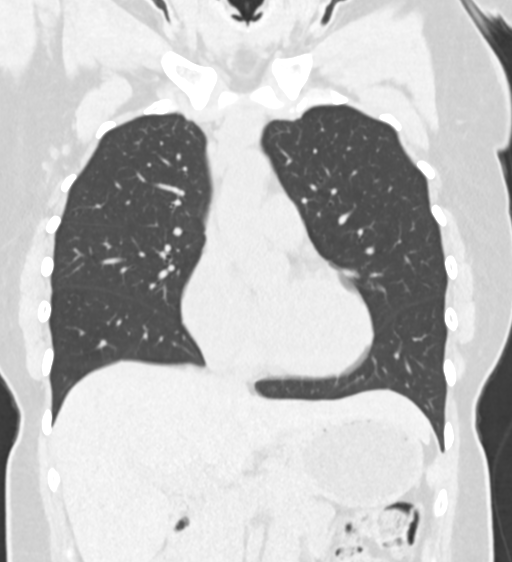
[im 70/116  lung]
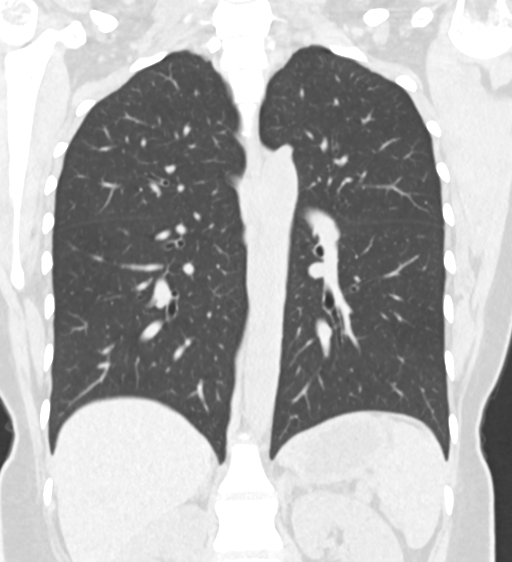

[15 of 36 positions shown; findings below may reference images not displayed]

FINDINGS: Cardiovascular: No significant vascular findings. Normal heart size.
No pericardial effusion.

Mediastinum/Nodes: No enlarged mediastinal, hilar, or axillary lymph
nodes. Thyroid gland, trachea, and esophagus demonstrate no
significant findings.

Lungs/Pleura: There has been interval resolution of a previously
seen irregular ground-glass opacity of the posterior inferior right
lung (series 3, image 63). There is unchanged bandlike scarring of
the anterior inferior right middle lobe. No pleural effusion or
pneumothorax.

Upper Abdomen: No acute abnormality.

Musculoskeletal: No chest wall mass or suspicious bone lesions
identified.
IMPRESSION: There has been interval resolution of a previously seen irregular
ground-glass opacity of the posterior inferior right lung (series 3,
image 63). Findings are consistent with interval resolution of
infection or inflammation.

## 2020-10-08 LAB — OB RESULTS CONSOLE RPR: RPR: NONREACTIVE

## 2020-10-08 LAB — OB RESULTS CONSOLE RUBELLA ANTIBODY, IGM: Rubella: IMMUNE

## 2020-10-08 LAB — OB RESULTS CONSOLE GC/CHLAMYDIA
Chlamydia: NEGATIVE
Gonorrhea: NEGATIVE

## 2020-10-08 LAB — OB RESULTS CONSOLE HIV ANTIBODY (ROUTINE TESTING): HIV: NONREACTIVE

## 2020-10-08 LAB — OB RESULTS CONSOLE HEPATITIS B SURFACE ANTIGEN: Hepatitis B Surface Ag: NEGATIVE

## 2020-10-08 LAB — OB RESULTS CONSOLE VARICELLA ZOSTER ANTIBODY, IGG: Varicella: IMMUNE

## 2020-12-29 NOTE — L&D Delivery Note (Signed)
Delivery Note  Date of delivery: 04/09/2021 Estimated Date of Delivery: 04/29/21 Patient's last menstrual period was 07/23/2020. EGA: [redacted]w[redacted]d   At 1:07 PM a viable female was delivered via Vaginal, Spontaneous (Presentation: Left Occiput Anterior).  APGAR: 9, 9; weight  Pending. Placenta status: Spontaneous, Intact.  Cord: 3 vessels with the following complications: None.    First Stage: Labor onset: 0830 Augmentation : None Analgesia /Anesthesia intrapartum: Epidural SROM at [facility] [employer] presented to L&D with SROM and active labor. Epidural placed for pain relief.   Second Stage: Complete dilation at 1235 Onset of pushing at 1245 FHR second stage Cat II Delivery at 1307 on 04/09/2021  She progressed to complete and had a spontaneous vaginal birth of a live female over an intact perineum. The fetal head was delivered in OA position with restitution to LOA. 1x nuchal cord summersault maneuver. Anterior then posterior shoulders delivered spontaneously. Baby placed on mom's abdomen and attended to by transition RN. Cord clamped and cut when pulseless by CNM.   Third Stage: Placenta delivered  intact with 3VC at 1312 Placenta disposition: routine disposal Uterine tone firm / bleeding min IV pitocin given for hemorrhage prophylaxis  Anesthesia: Epidural Episiotomy: None Lacerations: 2nd degree;Perineal;Vaginal Suture Repair: 2.0 vicryl Est. Blood Loss (mL):   Complications: none  Mom to postpartum.  Baby to Couplet care / Skin to Skin.  Newborn: Birth Weight: pending  Apgar Scores: 9, 9 Feeding planned: Both   Cyril Mourning, [place] 04/09/2021 1:33 PM

## 2021-04-06 ENCOUNTER — Inpatient Hospital Stay
Admission: EM | Admit: 2021-04-06 | Discharge: 2021-04-06 | DRG: 833 | Disposition: A | Discharge status: Home / Self Care | Payer: 59 | Attending: Obstetrics and Gynecology | Admitting: Obstetrics and Gynecology

## 2021-04-06 ENCOUNTER — Encounter: Payer: Self-pay | Admitting: Obstetrics and Gynecology

## 2021-04-06 ENCOUNTER — Other Ambulatory Visit: Payer: Self-pay

## 2021-04-06 DIAGNOSIS — Z0371 Encounter for suspected problem with amniotic cavity and membrane ruled out: Secondary | ICD-10-CM | POA: Diagnosis present

## 2021-04-06 DIAGNOSIS — O09523 Supervision of elderly multigravida, third trimester: Secondary | ICD-10-CM | POA: Diagnosis not present

## 2021-04-06 DIAGNOSIS — Z20822 Contact with and (suspected) exposure to covid-19: Secondary | ICD-10-CM | POA: Diagnosis present

## 2021-04-06 DIAGNOSIS — Z3A36 36 weeks gestation of pregnancy: Secondary | ICD-10-CM | POA: Diagnosis not present

## 2021-04-06 LAB — RAPID HIV SCREEN (HIV 1/2 AB+AG)
HIV 1/2 Antibodies: NONREACTIVE
HIV-1 P24 Antigen - HIV24: NONREACTIVE

## 2021-04-06 LAB — RUPTURE OF MEMBRANE (ROM)PLUS: Rom Plus: NEGATIVE

## 2021-04-06 LAB — CBC
HCT: 38.5 % (ref 36.0–46.0)
Hemoglobin: 12.4 g/dL (ref 12.0–15.0)
MCH: 26.3 pg (ref 26.0–34.0)
MCHC: 32.2 g/dL (ref 30.0–36.0)
MCV: 81.6 fL (ref 80.0–100.0)
Platelets: 203 10*3/uL (ref 150–400)
RBC: 4.72 MIL/uL (ref 3.87–5.11)
RDW: 14.8 % (ref 11.5–15.5)
WBC: 11 10*3/uL — ABNORMAL HIGH (ref 4.0–10.5)
nRBC: 0 % (ref 0.0–0.2)

## 2021-04-06 LAB — CHLAMYDIA/NGC RT PCR (ARMC ONLY)
Chlamydia Tr: NOT DETECTED
N gonorrhoeae: NOT DETECTED

## 2021-04-06 LAB — RESP PANEL BY RT-PCR (FLU A&B, COVID) ARPGX2
Influenza A by PCR: NEGATIVE
Influenza B by PCR: NEGATIVE
SARS Coronavirus 2 by RT PCR: NEGATIVE

## 2021-04-06 LAB — WET PREP, GENITAL
Clue Cells Wet Prep HPF POC: NONE SEEN
Sperm: NONE SEEN
Trich, Wet Prep: NONE SEEN
Yeast Wet Prep HPF POC: NONE SEEN

## 2021-04-06 LAB — ABO/RH: ABO/RH(D): B POS

## 2021-04-06 LAB — TYPE AND SCREEN
ABO/RH(D): B POS
Antibody Screen: NEGATIVE

## 2021-04-06 LAB — GROUP B STREP BY PCR: Group B strep by PCR: NEGATIVE

## 2021-04-06 MED ORDER — OXYTOCIN BOLUS FROM INFUSION: 333.0000 mL | Freq: Once | INTRAVENOUS | Status: DC

## 2021-04-06 MED ORDER — SOD CITRATE-CITRIC ACID 500-334 MG/5ML PO SOLN: 30.0000 mL | ORAL | Status: DC | PRN

## 2021-04-06 MED ORDER — FENTANYL CITRATE (PF) 100 MCG/2ML IJ SOLN: 50.0000 ug | INTRAMUSCULAR | Status: DC | PRN

## 2021-04-06 MED ORDER — ONDANSETRON HCL 4 MG/2ML IJ SOLN: 4.0000 mg | Freq: Four times a day (QID) | INTRAMUSCULAR | Status: DC | PRN

## 2021-04-06 MED ORDER — LACTATED RINGERS IV SOLN: 500.0000 mL | INTRAVENOUS | Status: DC | PRN

## 2021-04-06 MED ORDER — ACETAMINOPHEN 325 MG PO TABS: 650.0000 mg | ORAL_TABLET | ORAL | Status: AC | PRN

## 2021-04-06 MED ORDER — ACETAMINOPHEN 325 MG PO TABS: 650.0000 mg | ORAL_TABLET | ORAL | Status: DC | PRN

## 2021-04-06 MED ORDER — OXYTOCIN-SODIUM CHLORIDE 30-0.9 UT/500ML-% IV SOLN: 2.5000 [IU]/h | INTRAVENOUS | Status: DC

## 2021-04-06 MED ORDER — LACTATED RINGERS IV SOLN: INTRAVENOUS | Status: DC

## 2021-04-06 MED ORDER — LIDOCAINE HCL (PF) 1 % IJ SOLN: 30.0000 mL | INTRAMUSCULAR | Status: DC | PRN

## 2021-04-06 NOTE — OB Triage Note (Signed)
Pt noticed fluid running down her leg @ 0500 this am. Pt denies any fluid since then. Denies vaginal bleeding. Reports + fetal movement. Elaina Hoops

## 2021-04-06 NOTE — Discharge Summary (Signed)
Patient ID: Teresa Salinas MRN: [id number] DOB/AGE: 40/10/1981 40 y.o.  G3P1011 at [redacted]w[redacted]d with Estimated Date of Delivery: 04/29/21  Admit date: 04/06/2021 Discharge date: 04/06/2021  Admission Diagnoses: suspected ROM  Discharge Diagnoses: Preterm contractions  Prenatal Procedures: none  Consults: None  Significant Diagnostic Studies:  Results for orders placed or performed during the hospital encounter of 04/06/21 (from the past 168 hour(s))  Wet prep, genital   Collection Time: 04/06/21  2:10 PM   Specimen: Vaginal  Result Value Ref Range   Yeast Wet Prep HPF POC NONE SEEN NONE SEEN   Trich, Wet Prep NONE SEEN NONE SEEN   Clue Cells Wet Prep HPF POC NONE SEEN NONE SEEN   WBC, Wet Prep HPF POC MANY (A) NONE SEEN   Sperm NONE SEEN   Chlamydia/NGC rt PCR ([facility] only)   Collection Time: 04/06/21  2:10 PM   Specimen: Vaginal  Result Value Ref Range   Specimen source GC/Chlam ENDOCERVICAL    Chlamydia Tr NOT DETECTED NOT DETECTED   N gonorrhoeae NOT DETECTED NOT DETECTED  Group B strep by PCR   Collection Time: 04/06/21  2:10 PM   Specimen: Vaginal/Rectal; Genital  Result Value Ref Range   Group B strep by PCR NEGATIVE NEGATIVE  ROM Plus ([facility] only)   Collection Time: 04/06/21  2:10 PM  Result Value Ref Range   Rom Plus NEGATIVE   CBC   Collection Time: 04/06/21  4:32 PM  Result Value Ref Range   WBC 11.0 (H) 4.0 - 10.5 K/uL   RBC 4.72 3.87 - 5.11 MIL/uL   Hemoglobin 12.4 12.0 - 15.0 g/dL   HCT 88.5 02.7 - 74.1 %   MCV 81.6 80.0 - 100.0 fL   MCH 26.3 26.0 - 34.0 pg   MCHC 32.2 30.0 - 36.0 g/dL   RDW 28.7 86.7 - 67.2 %   Platelets 203 150 - 400 K/uL   nRBC 0.0 0.0 - 0.2 %  Type and screen [facility]   Collection Time: 04/06/21  4:33 PM  Result Value Ref Range   ABO/RH(D) B POS    Antibody Screen NEG    Sample Expiration      04/09/2021,2359 Performed at [facility], [address] [place], [place] [address]   Rapid HIV screen  [facility] L&D dept ONLY)   Collection Time: 04/06/21  4:34 PM  Result Value Ref Range   HIV-1 P24 Antigen - HIV24 NON REACTIVE NON REACTIVE   HIV 1/2 Antibodies NON REACTIVE NON REACTIVE   Interpretation (HIV Ag Ab)      A non reactive test result means that HIV 1 or HIV 2 antibodies and HIV 1 p24 antigen were not detected in the specimen.  ABO/Rh   Collection Time: 04/06/21  5:11 PM  Result Value Ref Range   ABO/RH(D)      B POS Performed at [facility], [address] [place], [place] [address]   Resp Panel by RT-PCR (Flu A&B, Covid) Nasopharyngeal Swab   Collection Time: 04/06/21  5:22 PM   Specimen: Nasopharyngeal Swab; Nasopharyngeal(NP) swabs in vial transport medium  Result Value Ref Range   SARS Coronavirus 2 by RT PCR NEGATIVE NEGATIVE   Influenza A by PCR NEGATIVE NEGATIVE   Influenza B by PCR NEGATIVE NEGATIVE    Treatments: IV hydration  Hospital Course:  This is a 40 y.o. G3P1011 with IUP at [redacted]w[redacted]d admitted for suspected ROM which was ruled out but noted to have frequent UCs  and SVE 3cm, then over the course of 1.5hrs, her cervix changed to 5/80-2 with bloody show but UCs had slowed after PO hydration. Pt was monitored, IV hydration given. UCs decreased and no cervical change after hours of monitoring.   Discharge Physical Exam:  BP (!) 145/85 (BP Location: Left Arm)   Pulse 71   Temp 98.4 F (36.9 C) (Oral)   Resp 16   Ht 5\' 4"  (1.626 m)   Wt 77.6 kg   LMP 07/23/2020   BMI 29.35 kg/m   General: NAD CV: RRR Pulm: CTABL, nl effort ABD: s/nd/nt, gravid DVT Evaluation: LE non-ttp, no evidence of DVT on exam.  SVE: 5/80/-1, soft/very posterior. No cervical change since last exam over 4hrs ago.  FHT: 130bpm, min-mod variability, + accels, no decels TOCO: q3-23min   Discharge Condition: Stable  Disposition: Discharge disposition: 01-Home or Self Care        Allergies as of 04/06/2021   No Known Allergies     Medication List    STOP  taking these medications   ibuprofen 600 MG tablet Commonly known as: ADVIL   oxyCODONE-acetaminophen 5-325 MG tablet Commonly known as: PERCOCET/ROXICET     TAKE these medications   acetaminophen 325 MG tablet Commonly known as: TYLENOL Take 2 tablets (650 mg total) by mouth every 4 (four) hours as needed (for pain scale < 4ORtemperature>/=100.5 F).   calcium carbonate 500 MG chewable tablet Commonly known as: TUMS - dosed in mg elemental calcium Chew 1 tablet by mouth daily as needed for indigestion or heartburn.   prenatal multivitamin Tabs tablet Take 1 tablet by mouth daily at 12 noon.       Follow-up Information    [facility]. Schedule an appointment as soon as possible for a visit.   Why: for this week in clinic Contact information: [address] [place] [address]              Signed:  [phone], CNM 04/06/2021  8:15 PM

## 2021-04-06 NOTE — H&P (Signed)
OB History & Physical   History of Present Illness:  Chief Complaint: suspected rupture of membranes.   HPI:  Teresa Salinas is a 40 y.o. G31P1011 female at [redacted]w[redacted]d dated by .  She presents to L&D for irreg contractions and suspected ROM- negative pooling and ROM Plus negative; noted bloody show and cervical change on repeat exam.   Pregnancy Issues: 1. Infertility, IUI pregnancy 2. Advanced maternal age 20. Hx GDM last preg.     Maternal Medical History:   Past Medical History:  Diagnosis Date  . GDM, class A2 01/02/2014  . Gestational diabetes mellitus, antepartum     Past Surgical History:  Procedure Laterality Date  . DILATION AND CURETTAGE OF UTERUS      No Known Allergies  Prior to Admission medications   Medication Sig Start Date End Date Taking? Authorizing Provider  Prenatal Vit-Fe Fumarate-FA (PRENATAL MULTIVITAMIN) TABS tablet Take 1 tablet by mouth daily at 12 noon.   Yes [provider]  calcium carbonate (TUMS - DOSED IN MG ELEMENTAL CALCIUM) 500 MG chewable tablet Chew 1 tablet by mouth daily as needed for indigestion or heartburn. Patient not taking: Reported on 04/06/2021    [provider]  ibuprofen (ADVIL,MOTRIN) 600 MG tablet Take 1 tablet (600 mg total) by mouth every 6 (six) hours. Patient not taking: Reported on 04/06/2021 01/04/14   Marlinda Mike., CNM  oxyCODONE-acetaminophen (PERCOCET/ROXICET) 5-325 MG per tablet Take 1 tablet by mouth every 4 (four) hours as needed for severe pain (moderate - severe pain). Patient not taking: Reported on 04/06/2021 01/04/14   Marlinda Mike., CNM     Prenatal care site: [facility]   Social History: She  reports that she has never smoked. She has never used smokeless tobacco. She reports that she does not drink alcohol and does not use drugs.  Family History: no family hx Gyn cancers.   Review of Systems: A full review of systems was performed and negative except as noted in the HPI.      Physical Exam:  Vital Signs: BP 134/72   Pulse 90   Temp 98.3 F (36.8 C) (Oral)   Resp 18   Ht 5\' 4"  (1.626 m)   Wt 77.6 kg   LMP 07/23/2020   BMI 29.35 kg/m  General: no acute distress.  HEENT: normocephalic, atraumatic Heart: regular rate & rhythm.  No murmurs/rubs/gallops Lungs: clear to auscultation bilaterally, normal respiratory effort Abdomen: soft, gravid, non-tender;  EFW: 7lbs Pelvic:  Initial exam 3/50/-2, soft/posterior. Repeat 5/70/-1, soft/posterior.   External: Normal external female genitalia, BBOW noted with bloody show on exam.     Extremities: non-tender, symmetric, no edema bilaterally.  DTRs: 2+  Neurologic: Alert & oriented x 3.    Results for orders placed or performed during the hospital encounter of 04/06/21 (from the past 24 hour(s))  ROM Plus ([facility] only)     Status: None   Collection Time: 04/06/21  2:10 PM  Result Value Ref Range   Rom Plus NEGATIVE   Wet prep, genital     Status: Abnormal   Collection Time: 04/06/21  2:10 PM   Specimen: Vaginal  Result Value Ref Range   Yeast Wet Prep HPF POC NONE SEEN NONE SEEN   Trich, Wet Prep NONE SEEN NONE SEEN   Clue Cells Wet Prep HPF POC NONE SEEN NONE SEEN   WBC, Wet Prep HPF POC MANY (A) NONE SEEN   Sperm NONE SEEN     Pertinent Results:  Prenatal  Labs: Blood type/Rh  B Pos  Antibody screen neg  Rubella Immune  Varicella Immune  RPR NR  HBsAg Neg  HIV NR  GC neg  Chlamydia neg  Genetic screening negative  1 hour GTT  98  3 hour GTT  n/a  GBS  pending   FHT: 130bpm, mod variability, + accels, no decels TOCO: q6-4min, palp mild to mod. On arrival, UCs q84min.    Cephalic by leopolds/exam  No results found.  Assessment:  Teresa Salinas is a 40 y.o. G56P1011 female at [redacted]w[redacted]d with preterm labor, advanced dilation.    Plan:  1. Admit to Labor & Delivery; consents reviewed and obtained - COVID swab on admit.  - expectant mgmt, will monitor for further cervical change. UCs have  decreased frequency since arrival after PO hydration.   2. Fetal Well being  - Fetal Tracing: Cat I tracing - Group B Streptococcus ppx indicated: PCR pending - Presentation: cephalic confirmed by exam   3. Routine OB: - Prenatal labs reviewed, as above - Rh  B Pos - CBC, T&S, RPR on admit - Clear fluids, IVF  4. Monitoring of Labor -  Contractions: external toco in place -  Pelvis proven to 2432grams -  Plan for augmentation if cervical change to 6 or more cm.  -  Plan for continuous fetal monitoring  -  Maternal pain control as desired - Anticipate vaginal delivery  5. Post Partum Planning: - Infant feeding: breast - Contraception: hx infertility, likely none.  - Flu declined - Tdap 03/13/21  Randa Ngo, CNM 04/06/21 3:39 PM

## 2021-04-07 LAB — RPR: RPR Ser Ql: NONREACTIVE

## 2021-04-09 ENCOUNTER — Inpatient Hospital Stay: Payer: 59 | Admitting: Anesthesiology

## 2021-04-09 ENCOUNTER — Encounter: Payer: Self-pay | Admitting: Obstetrics and Gynecology

## 2021-04-09 ENCOUNTER — Other Ambulatory Visit: Payer: Self-pay

## 2021-04-09 ENCOUNTER — Inpatient Hospital Stay
Admission: EM | Admit: 2021-04-09 | Discharge: 2021-04-11 | DRG: 807 | Disposition: A | Discharge status: Home / Self Care | Payer: 59 | Attending: Obstetrics and Gynecology | Admitting: Obstetrics and Gynecology

## 2021-04-09 DIAGNOSIS — O26893 Other specified pregnancy related conditions, third trimester: Secondary | ICD-10-CM | POA: Diagnosis present

## 2021-04-09 DIAGNOSIS — Z3A37 37 weeks gestation of pregnancy: Secondary | ICD-10-CM

## 2021-04-09 LAB — CBC
HCT: 40.3 % (ref 36.0–46.0)
Hemoglobin: 13.6 g/dL (ref 12.0–15.0)
MCH: 26.9 pg (ref 26.0–34.0)
MCHC: 33.7 g/dL (ref 30.0–36.0)
MCV: 79.6 fL — ABNORMAL LOW (ref 80.0–100.0)
Platelets: 202 10*3/uL (ref 150–400)
RBC: 5.06 MIL/uL (ref 3.87–5.11)
RDW: 15 % (ref 11.5–15.5)
WBC: 10.3 10*3/uL (ref 4.0–10.5)
nRBC: 0 % (ref 0.0–0.2)

## 2021-04-09 LAB — COMPREHENSIVE METABOLIC PANEL
ALT: 10 U/L (ref 0–44)
AST: 21 U/L (ref 15–41)
Albumin: 3.1 g/dL — ABNORMAL LOW (ref 3.5–5.0)
Alkaline Phosphatase: 123 U/L (ref 38–126)
Anion gap: 10 (ref 5–15)
BUN: 8 mg/dL (ref 6–20)
CO2: 19 mmol/L — ABNORMAL LOW (ref 22–32)
Calcium: 8.9 mg/dL (ref 8.9–10.3)
Chloride: 106 mmol/L (ref 98–111)
Creatinine, Ser: 0.62 mg/dL (ref 0.44–1.00)
GFR, Estimated: >60 mL/min (ref >60)
Glucose, Bld: 90 mg/dL (ref 70–99)
Potassium: 3.9 mmol/L (ref 3.5–5.1)
Sodium: 135 mmol/L (ref 135–145)
Total Bilirubin: 0.4 mg/dL (ref 0.3–1.2)
Total Protein: 6.5 g/dL (ref 6.5–8.1)

## 2021-04-09 LAB — TYPE AND SCREEN
ABO/RH(D): B POS
Antibody Screen: NEGATIVE

## 2021-04-09 MED ORDER — PHENYLEPHRINE 40 MCG/ML (10ML) SYRINGE FOR IV PUSH (FOR BLOOD PRESSURE SUPPORT): 80.0000 ug | PREFILLED_SYRINGE | INTRAVENOUS | Status: DC | PRN

## 2021-04-09 MED ORDER — OXYTOCIN 10 UNIT/ML IJ SOLN
INTRAMUSCULAR | Status: AC
  Filled 2021-04-09: qty 2

## 2021-04-09 MED ORDER — LIDOCAINE HCL (PF) 1 % IJ SOLN
INTRAMUSCULAR | Status: AC
  Filled 2021-04-09: qty 30

## 2021-04-09 MED ORDER — FERROUS SULFATE 325 (65 FE) MG PO TABS
325.0000 mg | ORAL_TABLET | Freq: Two times a day (BID) | ORAL | Status: DC
  Administered 2021-04-09 – 2021-04-11 (×4): 325 mg via ORAL
  Filled 2021-04-09 (×4): qty 1

## 2021-04-09 MED ORDER — DIPHENHYDRAMINE HCL 50 MG/ML IJ SOLN: 12.5000 mg | INTRAMUSCULAR | Status: DC | PRN

## 2021-04-09 MED ORDER — BUPIVACAINE HCL (PF) 0.25 % IJ SOLN
INTRAMUSCULAR | Status: DC | PRN
  Administered 2021-04-09 (×2): 4 mL via EPIDURAL

## 2021-04-09 MED ORDER — EPHEDRINE 5 MG/ML INJ: 10.0000 mg | INTRAVENOUS | Status: DC | PRN

## 2021-04-09 MED ORDER — OXYTOCIN-SODIUM CHLORIDE 30-0.9 UT/500ML-% IV SOLN
2.5000 [IU]/h | INTRAVENOUS | Status: DC
  Administered 2021-04-09: 2.5 [IU]/h via INTRAVENOUS

## 2021-04-09 MED ORDER — COCONUT OIL OIL: 1.0000 "application " | TOPICAL_OIL | Status: DC | PRN

## 2021-04-09 MED ORDER — ACETAMINOPHEN 325 MG PO TABS: 650.0000 mg | ORAL_TABLET | ORAL | Status: DC | PRN

## 2021-04-09 MED ORDER — IBUPROFEN 600 MG PO TABS
ORAL_TABLET | ORAL | Status: AC
  Administered 2021-04-09: 600 mg
  Filled 2021-04-09: qty 1

## 2021-04-09 MED ORDER — IBUPROFEN 600 MG PO TABS: 600.0000 mg | ORAL_TABLET | Freq: Once | ORAL | Status: AC

## 2021-04-09 MED ORDER — FENTANYL 2.5 MCG/ML W/ROPIVACAINE 0.15% IN NS 100 ML EPIDURAL (ARMC)
EPIDURAL | Status: AC
  Filled 2021-04-09: qty 100

## 2021-04-09 MED ORDER — OXYCODONE-ACETAMINOPHEN 5-325 MG PO TABS: 2.0000 | ORAL_TABLET | ORAL | Status: DC | PRN

## 2021-04-09 MED ORDER — DIBUCAINE (PERIANAL) 1 % EX OINT: 1.0000 "application " | TOPICAL_OINTMENT | CUTANEOUS | Status: DC | PRN

## 2021-04-09 MED ORDER — LABETALOL HCL 5 MG/ML IV SOLN: 40.0000 mg | INTRAVENOUS | Status: DC | PRN

## 2021-04-09 MED ORDER — ONDANSETRON HCL 4 MG/2ML IJ SOLN: 4.0000 mg | Freq: Four times a day (QID) | INTRAMUSCULAR | Status: DC | PRN

## 2021-04-09 MED ORDER — LACTATED RINGERS IV SOLN
500.0000 mL | Freq: Once | INTRAVENOUS | Status: AC
  Administered 2021-04-09: 500 mL via INTRAVENOUS

## 2021-04-09 MED ORDER — LIDOCAINE HCL (PF) 1 % IJ SOLN: 30.0000 mL | INTRAMUSCULAR | Status: DC | PRN

## 2021-04-09 MED ORDER — DOCUSATE SODIUM 100 MG PO CAPS
100.0000 mg | ORAL_CAPSULE | Freq: Two times a day (BID) | ORAL | Status: DC
  Administered 2021-04-10 – 2021-04-11 (×3): 100 mg via ORAL
  Filled 2021-04-09 (×3): qty 1

## 2021-04-09 MED ORDER — BENZOCAINE-MENTHOL 20-0.5 % EX AERO: 1.0000 "application " | INHALATION_SPRAY | CUTANEOUS | Status: DC | PRN

## 2021-04-09 MED ORDER — LABETALOL HCL 5 MG/ML IV SOLN: 80.0000 mg | INTRAVENOUS | Status: DC | PRN

## 2021-04-09 MED ORDER — LIDOCAINE HCL (PF) 1 % IJ SOLN
INTRAMUSCULAR | Status: DC | PRN
  Administered 2021-04-09: 3 mL

## 2021-04-09 MED ORDER — OXYCODONE HCL 5 MG PO TABS: 5.0000 mg | ORAL_TABLET | ORAL | Status: DC | PRN

## 2021-04-09 MED ORDER — OXYTOCIN-SODIUM CHLORIDE 30-0.9 UT/500ML-% IV SOLN
INTRAVENOUS | Status: AC
  Administered 2021-04-09: 333 mL via INTRAVENOUS
  Filled 2021-04-09: qty 1000

## 2021-04-09 MED ORDER — FENTANYL 2.5 MCG/ML W/ROPIVACAINE 0.15% IN NS 100 ML EPIDURAL (ARMC)
12.0000 mL/h | EPIDURAL | Status: DC
  Administered 2021-04-09: 12 mL/h via EPIDURAL

## 2021-04-09 MED ORDER — LACTATED RINGERS IV SOLN
500.0000 mL | INTRAVENOUS | Status: DC | PRN
  Administered 2021-04-09: 500 mL via INTRAVENOUS

## 2021-04-09 MED ORDER — IBUPROFEN 600 MG PO TABS
600.0000 mg | ORAL_TABLET | Freq: Four times a day (QID) | ORAL | Status: DC
  Administered 2021-04-09 – 2021-04-10 (×2): 600 mg via ORAL
  Filled 2021-04-09 (×2): qty 1

## 2021-04-09 MED ORDER — TETANUS-DIPHTH-ACELL PERTUSSIS 5-2.5-18.5 LF-MCG/0.5 IM SUSY: 0.5000 mL | PREFILLED_SYRINGE | Freq: Once | INTRAMUSCULAR | Status: DC

## 2021-04-09 MED ORDER — OXYCODONE-ACETAMINOPHEN 5-325 MG PO TABS: 1.0000 | ORAL_TABLET | ORAL | Status: DC | PRN

## 2021-04-09 MED ORDER — WITCH HAZEL-GLYCERIN EX PADS: 1.0000 "application " | MEDICATED_PAD | CUTANEOUS | Status: DC | PRN

## 2021-04-09 MED ORDER — DIPHENHYDRAMINE HCL 25 MG PO CAPS: 25.0000 mg | ORAL_CAPSULE | Freq: Four times a day (QID) | ORAL | Status: DC | PRN

## 2021-04-09 MED ORDER — PRENATAL MULTIVITAMIN CH
1.0000 | ORAL_TABLET | Freq: Every day | ORAL | Status: DC
  Administered 2021-04-10 – 2021-04-11 (×2): 1 via ORAL
  Filled 2021-04-09 (×2): qty 1

## 2021-04-09 MED ORDER — LABETALOL HCL 5 MG/ML IV SOLN: 20.0000 mg | INTRAVENOUS | Status: DC | PRN

## 2021-04-09 MED ORDER — HYDRALAZINE HCL 20 MG/ML IJ SOLN: 10.0000 mg | INTRAMUSCULAR | Status: DC | PRN

## 2021-04-09 MED ORDER — ONDANSETRON HCL 4 MG/2ML IJ SOLN: 4.0000 mg | INTRAMUSCULAR | Status: DC | PRN

## 2021-04-09 MED ORDER — OXYTOCIN BOLUS FROM INFUSION: 333.0000 mL | Freq: Once | INTRAVENOUS | Status: AC

## 2021-04-09 MED ORDER — BENZOCAINE-MENTHOL 20-0.5 % EX AERO
INHALATION_SPRAY | CUTANEOUS | Status: AC
  Filled 2021-04-09: qty 56

## 2021-04-09 MED ORDER — OXYCODONE HCL 5 MG PO TABS: 10.0000 mg | ORAL_TABLET | ORAL | Status: DC | PRN

## 2021-04-09 MED ORDER — LACTATED RINGERS IV SOLN: INTRAVENOUS | Status: DC

## 2021-04-09 MED ORDER — SIMETHICONE 80 MG PO CHEW: 80.0000 mg | CHEWABLE_TABLET | ORAL | Status: DC | PRN

## 2021-04-09 MED ORDER — SOD CITRATE-CITRIC ACID 500-334 MG/5ML PO SOLN: 30.0000 mL | ORAL | Status: DC | PRN

## 2021-04-09 MED ORDER — ONDANSETRON HCL 4 MG PO TABS: 4.0000 mg | ORAL_TABLET | ORAL | Status: DC | PRN

## 2021-04-09 MED ORDER — AMMONIA AROMATIC IN INHA
RESPIRATORY_TRACT | Status: AC
  Filled 2021-04-09: qty 10

## 2021-04-09 MED ORDER — LIDOCAINE-EPINEPHRINE (PF) 1.5 %-1:200000 IJ SOLN
INTRAMUSCULAR | Status: DC | PRN
  Administered 2021-04-09: 3 mL via EPIDURAL

## 2021-04-09 MED ORDER — MISOPROSTOL 200 MCG PO TABS
ORAL_TABLET | ORAL | Status: AC
  Filled 2021-04-09: qty 4

## 2021-04-09 NOTE — H&P (Signed)
OB History & Physical   History of Present Illness:  Chief Complaint:   HPI:  Teresa Salinas is a 40 y.o. G35P1011 female at [redacted]w[redacted]d dated by LMP.  She presents to L&D for SROM and labor.  She reports:  -active fetal movement -LOF/SROM at 0830 -no vaginal bleeding -onset of contractions at 0830 currently every 2-3 minutes  Pregnancy Issues: 1. 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 Advanced maternal age, will be 40yo at delivery 3. Hx A1GDM with last preg   Maternal Medical History:   Past Medical History:  Diagnosis Date  . GDM, class A2 01/02/2014  . Gestational diabetes mellitus, antepartum     Past Surgical History:  Procedure Laterality Date  . DILATION AND CURETTAGE OF UTERUS      No Known Allergies  Prior to Admission medications   Medication Sig Start Date End Date Taking? Authorizing Provider  acetaminophen (TYLENOL) 325 MG tablet Take 2 tablets (650 mg total) by mouth every 4 (four) hours as needed (for pain scale < 4ORtemperature>/=100.5 F). 04/06/21  Yes McVey, Prudencio Pair, CNM  Prenatal Vit-Fe Fumarate-FA (PRENATAL MULTIVITAMIN) TABS tablet Take 1 tablet by mouth daily at 12 noon.   Yes [provider]  calcium carbonate (TUMS - DOSED IN MG ELEMENTAL CALCIUM) 500 MG chewable tablet Chew 1 tablet by mouth daily as needed for indigestion or heartburn. Patient not taking: Reported on 04/06/2021    [provider]     Prenatal care site: [facility]   Social History: She  reports that she has never smoked. She has never used smokeless tobacco. She reports that she does not drink alcohol and does not use drugs.  Family History: family history is not on file.   Review of Systems: A full review of systems was performed and negative except as noted in the HPI.    Physical Exam:  Vital Signs: LMP 07/23/2020   General:   alert and cooperative  Skin:  normal  Neurologic:    Alert & oriented x 3  Lungs:   nl effort  Heart:   regular  rate and rhythm  Abdomen:  gravid, soft between UCs  Extremities: : non-tender, symmetric    EFW: (01/29/21) 1003 g 47 %    Pertinent Results:  Prenatal Labs: Blood type/Rh B pos  Antibody screen neg  Rubella Immune  Varicella Immune  RPR NR  HBsAg Neg  HIV NR  GC neg  Chlamydia neg  Genetic screening AFP neg  1 hour GTT 126  3 hour GTT   GBS neg   FHT: FHR: 120 bpm, variability: moderate,  accelerations:  Present,  decelerations:  Absent Category/reactivity:  Category I TOCO: regular, every 2-3 minutes SVE: Dilation: 8 /   / Station: 0     Assessment:  Teresa Salinas is a 40 y.o. G71P1011 female at [redacted]w[redacted]d with active labor.   Plan:  1. Admit to Labor & Delivery; consents reviewed and obtained  2. Fetal Well being  - Fetal Tracing: Cat I - GBS neg - Presentation: vtx confirmed by SVE   3. Routine OB: - Prenatal labs reviewed, as above - Rh pos - CBC & T&S on admit - Clear fluids, IVF  4. Monitoring of Labor -  Contractions external toco in place -  Plan for continuous fetal monitoring  -  Maternal pain control as desired: IVPM, nitrous, regional anesthesia - Anticipate vaginal delivery  5. Post Partum Planning: - Infant feeding: TBD - Contraception: TBD - Flu:  declined - Tdap: 03/13/21  Haroldine Laws, CNM 04/09/2021 10:47 AM

## 2021-04-09 NOTE — Anesthesia Procedure Notes (Addendum)
Epidural Patient location during procedure: OB Start time: 04/09/2021 11:56 AM  Staffing Anesthesiologist: Piscitello, Cleda Mccreedy, MD Resident/CRNA: Irving Burton, CRNA Performed: resident/CRNA   Preanesthetic Checklist Completed: patient identified, IV checked, site marked, risks and benefits discussed, surgical consent, monitors and equipment checked, pre-op evaluation and timeout performed  Epidural Patient position: sitting Prep: ChloraPrep Patient monitoring: heart rate, continuous pulse ox and blood pressure Approach: midline Location: L3-L4 Injection technique: LOR saline  Needle:  Needle type: Tuohy  Needle gauge: 17 G Needle length: 9 cm and 9 Needle insertion depth: 6 cm Catheter type: closed end flexible Catheter size: 19 Gauge Catheter at skin depth: 11 cm Test dose: negative and 1.5% lidocaine with Epi 1:200 K  Assessment Sensory level: T10 Events: blood not aspirated, injection not painful, no injection resistance, no paresthesia and negative IV test  Additional Notes 1 attempt Pt. Evaluated and documentation done after procedure finished. Patient identified. Risks/Benefits/Options discussed with patient including but not limited to bleeding, infection, nerve damage, paralysis, failed block, incomplete pain control, headache, blood pressure changes, nausea, vomiting, reactions to medication both or allergic, itching and postpartum back pain. Confirmed with bedside nurse the patient's most recent platelet count. Confirmed with patient that they are not currently taking any anticoagulation, have any bleeding history or any family history of bleeding disorders. Patient expressed understanding and wished to proceed. All questions were answered. Sterile technique was used throughout the entire procedure. Please see nursing notes for vital signs. Test dose was given through epidural catheter and negative prior to continuing to dose epidural or start infusion. Warning  signs of high block given to the patient including shortness of breath, tingling/numbness in hands, complete motor block, or any concerning symptoms with instructions to call for help. Patient was given instructions on fall risk and not to get out of bed. All questions and concerns addressed with instructions to call with any issues or inadequate analgesia.   Patient tolerated the insertion well without immediate complications.Reason for block:procedure for pain

## 2021-04-09 NOTE — Lactation Note (Signed)
This note was copied from a baby's chart. Lactation Consultation Note  Patient Name: Teresa Salinas [id number] Date: 04/09/2021 Reason for consult: L&D Initial assessment;Early term 37-38.6wks Age:40 hours  Lactation at bedside for first feeding attempt in L&D. This is mom's second baby. She has hx of difficult latch and over all low milk supply.  Baby born at 37 weeks is sleepy, not showing cues, but feeding attempt made. Baby was reluctant to latch, hand expression taught with no output noticed.  LC reviewed with mom feeding patterns of 37 week infants, and encouraged feeding with cues and implementation of pumping early on to help with milk supply build, and possible use for supplement if needed. Mom verbalized understanding, but notes she is ok with formula supplement if needed.  LC assisted with moving baby to chest, covering, placing of hat, and encouraged skin to skin. Transition RN monitoring low temperatures right now.  Maternal Data Has patient been taught Hand Expression?: Yes Does the patient have breastfeeding experience prior to this delivery?: Yes How long did the patient breastfeed?: 2 months  Feeding Mother's Current Feeding Choice: Breast Milk and Formula  LATCH Score Latch: Too sleepy or reluctant, no latch achieved, no sucking elicited.                  Lactation Tools Discussed/Used    Interventions Interventions: Breast feeding basics reviewed;Assisted with latch;Hand express;Adjust position;Education  Discharge    Consult Status Consult Status: Follow-up Date: 04/09/21 Follow-up type: In-patient    Danford Bad 04/09/2021, 2:27 PM

## 2021-04-09 NOTE — Lactation Note (Signed)
This note was copied from a baby's chart. Lactation Consultation Note  Patient Name: Boy Lennie Dunnigan [id number] Date: 04/09/2021 Reason for consult: L&D Initial assessment;Early term 37-38.6wks Age:40 hours  LC follow-up on MBU; mom and baby skin to skin, baby sleeping. Mom reports about 10-15 "tugs" at the breast before baby fell asleep.  LC encouraged feeding on demand, reviewed normal feeding patterns and behaviors in first 24 hours of life.  LC asked mom about desired feeding plan, mom has formula in the room, and said she may give some tonight and asked about volume.  LC encouraged offering breast first, and explained the impact that early formula introduction can have on establishing breastfeeding and building an adequate supply. Mom verbalizes understanding; LC answered appropriate volumes of no more than 60mL at this time.  Encouraged to seek support overnight for breastfeeding assistance if needed.  Discussed pump set-up due to baby's age, and mom's hx of low supply; mom declined at this time stating she wanted to rest and spend time skin to skin.  Maternal Data Has patient been taught Hand Expression?: Yes Does the patient have breastfeeding experience prior to this delivery?: Yes How long did the patient breastfeed?: 2 months  Feeding Mother's Current Feeding Choice: Breast Milk and Formula  LATCH Score Latch: Too sleepy or reluctant, no latch achieved, no sucking elicited.                  Lactation Tools Discussed/Used    Interventions Interventions: Breast feeding basics reviewed;Assisted with latch;Hand express;Adjust position;Education  Discharge    Consult Status Consult Status: Follow-up Date: 04/09/21 Follow-up type: In-patient    Danford Bad 04/09/2021, 4:21 PM

## 2021-04-09 NOTE — Anesthesia Preprocedure Evaluation (Signed)
Anesthesia Evaluation  Patient identified by MRN, date of birth, ID band Patient awake    Reviewed: Allergy & Precautions, NPO status , Patient's Chart, lab work & pertinent test results  Airway Mallampati: II       Dental   Pulmonary neg pulmonary ROS,           Cardiovascular negative cardio ROS       Neuro/Psych negative neurological ROS  negative psych ROS   GI/Hepatic Neg liver ROS, GERD  ,  Endo/Other  diabetes, Type 2  Renal/GU      Musculoskeletal   Abdominal   Peds  Hematology negative hematology ROS (+)   Anesthesia Other Findings   Reproductive/Obstetrics (+) Pregnancy                             Anesthesia Physical Anesthesia Plan  ASA: II  Anesthesia Plan: Epidural   Post-op Pain Management:    Induction:   PONV Risk Score and Plan:   Airway Management Planned:   Additional Equipment:   Intra-op Plan:   Post-operative Plan:   Informed Consent: I have reviewed the patients History and Physical, chart, labs and discussed the procedure including the risks, benefits and alternatives for the proposed anesthesia with the patient or authorized representative who has indicated his/her understanding and acceptance.     Dental Advisory Given  Plan Discussed with: CRNA and Anesthesiologist  Anesthesia Plan Comments:         Anesthesia Quick Evaluation

## 2021-04-09 NOTE — OB Triage Note (Signed)
Pt presented to L/D triage with reported contractions that began at 0830 and have increased in frequency and intensity-rated 8/10. Pt reports gush of clear fluid at 0830 with no bleeding. Pt reports positive fetal movement. Monitors applied and assessing. Initial BP 151/93- cycling q15 min.

## 2021-04-09 NOTE — Progress Notes (Signed)
1110 Pt requests epidural 1115 Dr. Ramiro Harvest notified 1135 Dr. Orson Ape notified  Victorino Dike CRNA In Room 1200 catheter placed 1200 Test Dose Maternal HR 79 1205 Bolus 1 1208 Bolus 2 1209 Drip Started

## 2021-04-09 NOTE — Discharge Summary (Signed)
Obstetrical Discharge Summary  Patient Name: Teresa Salinas DOB: [birth year] MRN: [id number]  Date of Admission: 04/09/2021 Date of Delivery: 04/09/20 Delivered by: Anselm Pancoast Date of Discharge: 04/11/2021  Primary OB: [facility] [id number] last menstrual period was 07/23/2020. EDC Estimated Date of Delivery: 04/29/21 Gestational Age at Delivery: [redacted]w[redacted]d   Antepartum complications:  1. 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 Advanced maternal age, will be 40yo at delivery 3. Hx A1GDM with last preg  Admitting Diagnosis: Active labor Secondary Diagnosis: Patient Active Problem List   Diagnosis Date Noted  . NSVD (normal spontaneous vaginal delivery) 04/09/2021    Augmentation: N/A Complications: None  Intrapartum complications/course: see delivery notes Delivery Type: spontaneous vaginal delivery Anesthesia: epidural Placenta: spontaneous Laceration: 2nd degree Episiotomy: none Newborn Data: "Marlaina, [place]urn [[id number]]  Live born female  Birth Weight:  6#3 APGAR: 75, 9  Newborn Delivery   Birth date/time: 04/09/2021 13:07:00 Delivery type: Vaginal, Spontaneous      Postpartum Procedures: none  Post partum course:  Patient had an uncomplicated postpartum course.  By time of discharge on PPD#2, her pain was controlled on oral pain medications; she had appropriate lochia and was ambulating, voiding without difficulty and tolerating regular diet.  She was deemed stable for discharge to home.    Discharge Physical Exam:  BP 123/73 (BP Location: Left Arm)   Pulse 75   Temp 97.8 F (36.6 C) (Oral)   Resp 18   Ht 5\' 4"  (1.626 m)   Wt 77.6 kg   LMP 07/23/2020   SpO2 99%   Breastfeeding Unknown   BMI 29.35 kg/m   General: alert and no distress Pulm: normal respiratory effort Lochia: appropriate Abdomen: soft, NT Uterine Fundus: firm, below umbilicus Lochia: small Fundus: firm, 2below umb Extremities: No evidence of DVT seen on physical exam.  No lower extremity edema. Edinburgh07/28/2021 Postnatal Depression Scale Screening Tool 04/10/2021  I have been able to laugh and see the funny side of things. 0  I have looked forward with enjoyment to things. 0  I have blamed myself unnecessarily when things went wrong. 0  I have been anxious or worried for no good reason. 2  I have felt scared or panicky for no good reason. 0  Things have been getting on top of me. 1  I have been so unhappy that I have had difficulty sleeping. 0  I have felt sad or miserable. 0  I have been so unhappy that I have been crying. 0  The thought of harming myself has occurred to me. 0  Edinburgh Postnatal Depression Scale Total 3     Labs: CBC Latest Ref Rng & Units 04/10/2021 04/09/2021 04/06/2021  WBC 4.0 - 10.5 K/uL 16.5(H) 10.3 11.0(H)  Hemoglobin 12.0 - 15.0 g/dL 06/06/2021 96.2 22.9  Hematocrit 36.0 - 46.0 % 38.1 40.3 38.5  Platelets 150 - 400 K/uL 203 202 203   B POS Hemoglobin  Date Value Ref Range Status  04/10/2021 12.8 12.0 - 15.0 g/dL Final   HGB  Date Value Ref Range Status  07/13/2012 14.1 12.0 - 16.0 g/dL Final   HCT  Date Value Ref Range Status  04/10/2021 38.1 36.0 - 46.0 % Final  07/13/2012 44.5 35.0 - 47.0 % Final    Disposition: stable, discharge to home Baby Feeding: Both Baby Disposition: home with mom  Contraception: TBD  Prenatal Labs:  Blood type/Rh B pos  Antibody screen neg  Rubella Immune  Varicella Immune  RPR NR  HBsAg Neg  HIV NR  GC neg  Chlamydia neg  Genetic screening AFP neg  1 hour GTT 126  3 hour GTT   GBS neg   Rh Immune globulin given: N/a Rubella vaccine given: Immune Varicella vaccine given: Immune Tdap vaccine given in AP or PP setting: 03/13/21 Flu vaccine given in AP or PP setting: Declined  Plan: Teresa Salinas was discharged to home in good condition. Follow-up appointment with delivering provider in 6 weeks.  Discharge Instructions: Per After Visit Summary. Activity: Advance  as tolerated. Pelvic rest for 6 weeks.   Diet: Regular Discharge Medications: Allergies as of 04/11/2021   No Known Allergies     Medication List    TAKE these medications   acetaminophen 325 MG tablet Commonly known as: TYLENOL Take 2 tablets (650 mg total) by mouth every 4 (four) hours as needed (for pain scale < 4ORtemperature>/=100.5 F).   benzocaine-Menthol 20-0.5 % Aero Commonly known as: DERMOPLAST Apply 1 application topically as needed for irritation (perineal discomfort).   calcium carbonate 500 MG chewable tablet Commonly known as: TUMS - dosed in mg elemental calcium Chew 1 tablet by mouth daily as needed for indigestion or heartburn.   coconut oil Oil Apply 1 application topically as needed.   docusate sodium 100 MG capsule Commonly known as: COLACE Take 1 capsule (100 mg total) by mouth 2 (two) times daily.   ferrous sulfate 325 (65 FE) MG tablet Take 1 tablet (325 mg total) by mouth 2 (two) times daily with a meal.   ibuprofen 600 MG tablet Commonly known as: ADVIL Take 1 tablet (600 mg total) by mouth every 6 (six) hours.   prenatal multivitamin Tabs tablet Take 1 tablet by mouth daily at 12 noon.   witch hazel-glycerin pad Commonly known as: TUCKS Apply 1 application topically as needed for hemorrhoids.      Outpatient follow up:   Follow-up Information    Haroldine Laws, CNM. Schedule an appointment as soon as possible for a visit in 6 week(s).   Specialty: Certified Nurse Midwife Why: routine postpartum Contact information: [address] [place] [address] [phone]              No follow-ups on file.   Signed: Randa Ngo, CNM 04/11/2021 11:25 AM

## 2021-04-10 LAB — CBC
HCT: 38.1 % (ref 36.0–46.0)
Hemoglobin: 12.8 g/dL (ref 12.0–15.0)
MCH: 27.1 pg (ref 26.0–34.0)
MCHC: 33.6 g/dL (ref 30.0–36.0)
MCV: 80.5 fL (ref 80.0–100.0)
Platelets: 203 10*3/uL (ref 150–400)
RBC: 4.73 MIL/uL (ref 3.87–5.11)
RDW: 14.7 % (ref 11.5–15.5)
WBC: 16.5 10*3/uL — ABNORMAL HIGH (ref 4.0–10.5)
nRBC: 0 % (ref 0.0–0.2)

## 2021-04-10 LAB — RPR: RPR Ser Ql: NONREACTIVE

## 2021-04-10 MED ORDER — IBUPROFEN 600 MG PO TABS
600.0000 mg | ORAL_TABLET | Freq: Four times a day (QID) | ORAL | Status: DC
  Administered 2021-04-10 – 2021-04-11 (×5): 600 mg via ORAL
  Filled 2021-04-10 (×5): qty 1

## 2021-04-10 NOTE — Anesthesia Postprocedure Evaluation (Signed)
Anesthesia Post Note  Patient: Teresa Salinas  Procedure(s) Performed: AN AD HOC LABOR EPIDURAL  Patient location during evaluation: Mother Baby Anesthesia Type: Epidural Level of consciousness: awake and alert Pain management: pain level controlled Vital Signs Assessment: post-procedure vital signs reviewed and stable Respiratory status: spontaneous breathing, nonlabored ventilation and respiratory function stable Cardiovascular status: stable Postop Assessment: no headache, no backache and epidural receding Anesthetic complications: no   No complications documented.   Last Vitals:  Vitals:   04/10/21 0722 04/10/21 1102  BP: 136/76 129/83  Pulse: 66 84  Resp: 18 18  Temp: 36.8 C 36.7 C  SpO2: 98% 97%    Last Pain:  Vitals:   04/10/21 1102  TempSrc: Oral  PainSc:                  Karoline Caldwell

## 2021-04-10 NOTE — Progress Notes (Signed)
MOB attempted to breastfeed. Pt expressed continued desire to feed baby breast milk. RN provided MOB with breast pump starter kit and education regarding breast pump equipment.

## 2021-04-10 NOTE — Lactation Note (Signed)
This note was copied from a baby's chart. Lactation Consultation Note  Patient Name: Teresa Salinas JJHER'D Date: 04/10/2021 Reason for consult: Follow-up assessment;Early term 37-38.6wks Age:40 hours   Lactation in room to encourage and assist with another feeding attempt. Mom states she desires to try it on her own.  Reviewed feeding plan of: attempt/put baby to breast, if not waking after 5-10 minutes, discontinue efforts, offer supplement of at least 34mL (based on HOL), and mom pump for breast stimulation.  Parents verbalized understanding.  2:45pm- check in: Mom actively pumping. Mom states that baby had stool diaper and it took a while to clean it up, and then attempt at breast was unsuccessful- "baby did not wake up, we tried everything". RN in room to assist with bottle feed of formula.  Maternal Data Has patient been taught Hand Expression?: Yes Does the patient have breastfeeding experience prior to this delivery?: Yes  Feeding Mother's Current Feeding Choice: Breast Milk and Formula  LATCH Score Latch:  (mom wanted to attempt on her own)                  Lactation Tools Discussed/Used Tools: Pump Breast pump type: Double-Electric Breast Pump Pumping frequency: q3hrs  Interventions Interventions: Breast feeding basics reviewed;Education  Discharge    Consult Status Consult Status: Follow-up Date: 04/10/21 Follow-up type: In-patient    Danford Bad 04/10/2021, 2:42 PM

## 2021-04-10 NOTE — Lactation Note (Signed)
This note was copied from a baby's chart. Lactation Consultation Note  Patient Name: Teresa Salinas [id number] Date: 04/10/2021 Reason for consult: Follow-up assessment;Early term 37-38.6wks;Other (Comment) Age:40 hours  Lactation at bedside to assist with attempt feeding. 37 week infant has been sleepy with inconsistent feeding pattern and no more than a few sucks at the breast. 2 formula supplementations given of small amounts, with long periods between feeding.  Mom had pumped 1-2x overnight and has not pumped yet this morning. Lactation talked with mom and dad about feeding plan. LC explained the need for baby to feed frequently based on gestational age and the suggestion for pumping every 3 hours to help build supply. Parents verbalized understanding.   Lactation worked with mom and baby to help get baby to successfully feed at the breast. Attempts made for 20 minutes. Baby unwrapped, taken away from mom, wet wash cloth and wipe used, among other techniques to wake baby; baby did not wake up or become alert enough to elicit a latch.   Parents discussed supplementation and opted to continue with Similac while mom pumped for stimulation. LC assisted with bottle feed. Baby consumed 39mL over 20 minutes; however LC consistently had to work to get baby to open and accept bottle giving frequent breaks and burping.   Mom pumped for 15 minutes, flanges had colostrum. LC used gloved finger to get colostrum and rub on inside cheek and gum and baby. LC reiterated the importance of pumping frequency to aid in building supply and chance for providing baby breastmilk. Mom agrees to pumping every 3hrs today and states she has 2 pumps at home that she is familiar with.  Lactation name/number updated on whiteboard.  Maternal Data Has patient been taught Hand Expression?: Yes Does the patient have breastfeeding experience prior to this delivery?: Yes How long did the patient breastfeed?: 2  months  Feeding Mother's Current Feeding Choice: Breast Milk and Formula Nipple Type: Slow - flow (no latch)  LATCH Score Latch: Too sleepy or reluctant, no latch achieved, no sucking elicited. (no latch achieved)                  Lactation Tools Discussed/Used Tools: Pump Breast pump type: Double-Electric Breast Pump Pump Education: Setup, frequency, and cleaning;Milk Storage Reason for Pumping: 37wk; no latch Pumping frequency: q 3hrs  Interventions Interventions: Breast feeding basics reviewed;Skin to skin;Hand express;Breast massage;Breast compression;Adjust position;Support pillows;Position options;Education;DEBP (formula)  Discharge Pump: Personal (2 pumps at home)  Consult Status Consult Status: Follow-up Date: 04/10/21 Follow-up type: In-patient    Danford Bad 04/10/2021, 10:59 AM

## 2021-04-10 NOTE — Progress Notes (Signed)
Post Partum Day 1 Subjective: Doing well, no complaints.  Tolerating regular diet, pain with PO meds, voiding and ambulating without difficulty.  No CP SOB Fever,Chills, N/V or leg pain; denies nipple or breast pain, no HA change of vision, RUQ/epigastric pain  Objective: BP 129/83 (BP Location: Right Arm)   Pulse 84   Temp 98 F (36.7 C) (Oral)   Resp 18   Ht 5\' 4"  (1.626 m)   Wt 77.6 kg   LMP 07/23/2020   SpO2 97%   Breastfeeding Unknown   BMI 29.35 kg/m    Physical Exam:  General: NAD Breasts: soft/nontender CV: RRR Pulm: nl effort, CTABL Abdomen: soft, NT, BS x 4 Perineum: minimal edema, laceration repair well approximated Lochia: small Uterine Fundus: fundus firm and 1 fb below umbilicus DVT Evaluation: no cords, ttp LEs   Recent Labs    04/09/21 1100 04/10/21 0525  HGB 13.6 12.8  HCT 40.3 38.1  WBC 10.3 16.5*  PLT 202 203    Assessment/Plan: 40 y.o. 04/12/21 postpartum day # 1  - Continue routine PP care - Lactation consult prn, infant struggling with feeding.   - CBC reviewed, leukocytosis c/w labor and delivery process. Afebrile. Stable Hgb - Immunization status:  all Imms up to date    Disposition: Does not desire Dc home today.     [id number], CNM 04/10/2021  1:51 PM

## 2021-04-11 MED ORDER — IBUPROFEN 600 MG PO TABS: 600.0000 mg | ORAL_TABLET | Freq: Four times a day (QID) | ORAL | 0 refills | Status: AC

## 2021-04-11 MED ORDER — COCONUT OIL OIL: 1.0000 "application " | TOPICAL_OIL | 0 refills | Status: AC | PRN

## 2021-04-11 MED ORDER — WITCH HAZEL-GLYCERIN EX PADS: 1.0000 "application " | MEDICATED_PAD | CUTANEOUS | 12 refills | Status: AC | PRN

## 2021-04-11 MED ORDER — DOCUSATE SODIUM 100 MG PO CAPS: 100.0000 mg | ORAL_CAPSULE | Freq: Two times a day (BID) | ORAL | 0 refills | Status: AC

## 2021-04-11 MED ORDER — BENZOCAINE-MENTHOL 20-0.5 % EX AERO: 1.0000 "application " | INHALATION_SPRAY | CUTANEOUS | Status: AC | PRN

## 2021-04-11 MED ORDER — FERROUS SULFATE 325 (65 FE) MG PO TABS: 325.0000 mg | ORAL_TABLET | Freq: Two times a day (BID) | ORAL | 0 refills | Status: AC

## 2021-04-11 NOTE — Lactation Note (Signed)
This note was copied from a baby's chart. Lactation Consultation Note  Patient Name: Boy Saje Gallop [id number] Date: 04/11/2021 Reason for consult: Follow-up assessment;Early term 37-38.6wks Age:40 years  Discharge consultation for P2 mom. Mom was feeding baby formula upon entry, using pace bottle feeding.  Reports that feedings are going well; baby is taking formula via paced bottle feeding, as well as drops of colostrum via finger feeding. Mom reports that she wants to pump, breast, and bottle feed when returning home. [facility] student went over discharge education. Mom had concerns of milk volume and how to increase supply; mom was educated on lactogenesis II, as well as cluster feeding, signs of effective feedings, frequency of feedings, and pumping after feedings. [facility] student reviewed how to care for and clean pump materials as well as milk storage guidelines. Mother was made aware of lactation services. Encouraged mother to take care of herself; staying hydrated, nutrition, and resting. All questions answered at this time.   Feeding plan: 1. Skin to skin 2. Feed baby on demand at least 8 times a day 3. Pump after feedings for no more than 15-20 minutes 4. Hand express before feedings 5. Stay hydrated, rest, eat balanced meals 6. Paced bottle feeding Maternal Data Has patient been taught Hand Expression?: Yes Does the patient have breastfeeding experience prior to this delivery?: Yes How long did the patient breastfeed?: 1 month  Feeding Mother's Current Feeding Choice: Breast Milk and Formula Nipple Type: Slow - flow  LATCH Score                    Lactation Tools Discussed/Used Tools: Pump;Bottle Breast pump type: Double-Electric Breast Pump Pump Education: Milk Storage;Setup, frequency, and cleaning Reason for Pumping: breast stimulation  Interventions Interventions: Breast feeding basics reviewed;Education  Discharge Discharge Education: Warning signs for feeding  baby;Engorgement and breast care  Consult Status Consult Status: Complete Follow-up type: In-patient    Cay Schillings 04/11/2021, 10:37 AM

## 2021-04-11 NOTE — Discharge Instructions (Signed)
Discharge Instructions:   Follow-up Appointment:  If there are any new medications, they have been ordered and will be available for pickup at the listed pharmacy on your way home from the hospital.   Call office if you have any of the following: headache, visual changes, fever >101.0 F, chills, shortness of breath, breast concerns, excessive vaginal bleeding, incision drainage or problems, leg pain or redness, depression or any other concerns. If you have vaginal discharge with an odor, let your doctor know.   It is normal to bleed for up to 6 weeks. You should not soak through more than 1 pad in 1 hour. If you have a blood clot larger than your fist with continued bleeding, call your doctor.   Activity: Do not lift > 10 lbs for 6 weeks (do not lift anything heavier than your baby). No intercourse, tampons, swimming pools, hot tubs, baths (only showers) for 6 weeks.  No driving for 1-2 weeks. Continue prenatal vitamin, especially if breastfeeding. Increase calories and fluids (water) while breastfeeding.   Your milk will come in, in the next couple of days (right now it is colostrum). You may have a slight fever when your milk comes in, but it should go away on its own.  If it does not, and rises above 101 F please call the doctor. You will also feel achy and your breasts will be firm. They will also start to leak. If you are breastfeeding, continue as you have been and you can pump/express milk for comfort.   If you have too much milk, your breasts can become engorged, which could lead to mastitis. This is an infection of the milk ducts. It can be very painful and you will need to notify your doctor to obtain a prescription for antibiotics. You can also treat it with a shower or hot/cold compress.   For concerns about your baby, please call your pediatrician.  For breastfeeding concerns, the lactation consultant can be reached at [phone].   Postpartum blues (feelings of happy one minute  and sad another minute) are normal for the first few weeks but if it gets worse let your doctor know.   Congratulations! We enjoyed caring for you and your new bundle of joy!  Postpartum Care After Vaginal Delivery The following information offers guidance about how to care for yourself from the time you deliver your baby to 6-12 weeks after delivery (postpartum period). If you have problems or questions, contact your health care provider for more specific instructions. Follow these instructions at home: Vaginal bleeding  It is normal to have vaginal bleeding (lochia) after delivery. Wear a sanitary pad for bleeding and discharge. ? During the first week after delivery, the amount and appearance of lochia is often similar to a menstrual period. ? Over the next few weeks, it will gradually decrease to a dry, yellow-brown discharge. ? For most women, lochia stops completely by 4-6 weeks after delivery, but can vary.  Change your sanitary pads frequently. Watch for any changes in your flow, such as: ? A sudden increase in volume. ? A change in color. ? Large blood clots.  If you pass a blood clot from your vagina, save it and call your health care provider. Do not flush blood clots down the toilet before talking with your health care provider.  Do not use tampons or douches until your health care provider approves.  If you are not breastfeeding, your period should return 6-8 weeks after delivery. If you are feeding  your baby breast milk only, your period may not return until you stop breastfeeding. Perineal care  Keep the area between the vagina and the anus (perineum) clean and dry. Use medicated pads and pain-relieving sprays and creams as directed.  If you had a surgical cut in the perineum (episiotomy) or a tear, check the area for signs of infection until you are healed. Check for: ? More redness, swelling, or pain. ? Fluid or blood coming from the cut or tear. ? Warmth. ? Pus or a  bad smell.  You may be given a squirt bottle to use instead of wiping to clean the perineum area after you use the bathroom. Pat the area gently to dry it.  To relieve pain caused by an episiotomy, a tear, or swollen veins in the anus (hemorrhoids), take a warm sitz bath 2-3 times a day. In a sitz bath, the warm water should only come up to your hips and cover your buttocks.   Breast care  In the first few days after delivery, your breasts may feel heavy, full, and uncomfortable (breast engorgement). Milk may also leak from your breasts. Ask your health care provider about ways to help relieve the discomfort.  If you are breastfeeding: ? Wear a bra that supports your breasts and fits well. Use breast pads to absorb milk that leaks. ? Keep your nipples clean and dry. Apply creams and ointments as told. ? You may have uterine contractions every time you breastfeed for up to several weeks after delivery. This helps your uterus return to its normal size. ? If you have any problems with breastfeeding, notify your health care provider or lactation consultant.  If you are not breastfeeding: ? Avoid touching your breasts. Do not squeeze out (express) milk. Doing this can make your breasts produce more milk. ? Wear a good-fitting bra and use cold packs to help with swelling. Intimacy and sexuality  Ask your health care provider when you can engage in sexual activity. This may depend upon: ? Your risk of infection. ? How fast you are healing. ? Your comfort and desire to engage in sexual activity.  You are able to get pregnant after delivery, even if you have not had your period. Talk with your health care provider about methods of birth control (contraception) or family planning if you desire future pregnancies. Medicines  Take over-the-counter and prescription medicines only as told by your health care provider.  Take an over-the-counter stool softener to help ease bowel movements as told by  your health care provider.  If you were prescribed an antibiotic medicine, take it as told by your health care provider. Do not stop taking the antibiotic even if you start to feel better.  Review all previous and current prescriptions to check for possible transfer into breast milk. Activity  Gradually return to your normal activities as told by your health care provider.  Rest as much as possible. Nap while your baby is sleeping. Eating and drinking  Drink enough fluid to keep your urine pale yellow.  To help prevent or relieve constipation, eat high-fiber foods every day.  Choose healthy eating to support breastfeeding or weight loss goals.  Take your prenatal vitamins until your health care provider tells you to stop.   General tips/recommendations  Do not use any products that contain nicotine or tobacco. These products include cigarettes, chewing tobacco, and vaping devices, such as e-cigarettes. If you need help quitting, ask your health care provider.  Do not drink  alcohol, especially if you are breastfeeding.  Do not take medications or drugs that are not prescribed to you, especially if you are breastfeeding.  Visit your health care provider for a postpartum checkup within the first 3-6 weeks after delivery.  Complete a comprehensive postpartum visit no later than 12 weeks after delivery.  Keep all follow-up visits for you and your baby. Contact a health care provider if:  You feel unusually sad or worried.  Your breasts become red, painful, or hard.  You have a fever or other signs of an infection.  You have bleeding that is soaking through one pad an hour or you have blood clots.  You have a severe headache that doesn't go away or you have vision changes.  You have nausea and vomiting and are unable to eat or drink anything for 24 hours. Get help right away if:  You have chest pain or difficulty breathing.  You have sudden, severe leg pain.  You faint or  have a seizure.  You have thoughts about hurting yourself or your baby. If you ever feel like you may hurt yourself or others, or have thoughts about taking your own life, get help right away. Go to your nearest emergency department or:  Call your local emergency services (911 in the [place]  The National Suicide Prevention Lifeline at 504-176-6070. This suicide crisis helpline is open 24 hours a day.  Text the Crisis Text Line at 720 456 8304 (in the [place] Summary  The period of time after you deliver your newborn up to 6-12 weeks after delivery is called the postpartum period.  Keep all follow-up visits for you and your baby.  Review all previous and current prescriptions to check for possible transfer into breast milk.  Contact a health care provider if you feel unusually sad or worried during the postpartum period. This information is not intended to replace advice given to you by your health care provider. Make sure you discuss any questions you have with your health care provider. Document Revised: 08/30/2020 Document Reviewed: 08/30/2020 [employer] Patient Education  2021 [employer]   Postpartum Baby Blues The postpartum period begins right after the birth of a baby. During this time, there is often joy and excitement. It is also a time of many changes in the life of the parents. A mother may feel happy one minute and sad or stressed the next. These feelings of sadness, called the baby blues, usually happen in the period right after the baby is born and go away within a week or two. What are the causes? The exact cause of this condition is not known. Changes in hormone levels after childbirth are believed to trigger some of the symptoms. Other factors that can play a role in these mood changes include:  Lack of sleep.  Stressful life events, such as financial problems, caring for a loved one, or death of a loved one.  Genetics. What are the signs or symptoms? Symptoms of this  condition include:  Changes in mood, such as going from extreme happiness to sadness.  A decrease in concentration.  Difficulty sleeping.  Crying spells and tearfulness.  Loss of appetite.  Irritability.  Anxiety. If these symptoms last for more than 2 weeks or become more severe, you may have postpartum depression. How is this diagnosed? This condition is diagnosed based on an evaluation of your symptoms. Your health care provider may use a screening tool that includes a list of questions to help identify a person with  the baby blues or postpartum depression. How is this treated? The baby blues usually go away on their own in 1-2 weeks. Social support is often what is needed. You will be encouraged to get adequate sleep and rest. Follow these instructions at home: Lifestyle  Get as much rest as you can. Take a nap when the baby sleeps.  Exercise regularly as told by your health care provider. Some women find yoga and walking to be helpful.  Eat a balanced and nourishing diet. This includes plenty of fruits and vegetables, whole grains, and lean proteins.  Do little things that you enjoy. Take a bubble bath, read your favorite magazine, or listen to your favorite music.  Avoid alcohol.  Ask for help with household chores, cooking, grocery shopping, or running errands. Do not try to do everything yourself. Consider hiring a postpartum doula to help. This is a professional who specializes in providing support to new mothers.  Try not to make any major life changes during pregnancy or right after giving birth. This can add stress.      General instructions  Talk to people close to you about how you are feeling. Get support from your partner, family members, friends, or other new moms. You may want to join a support group.  Find ways to manage stress. This may include: ? Writing your thoughts and feelings in a journal. ? Spending time outside. ? Spending time with people who  make you laugh.  Try to stay positive in how you think. Think about the things you are grateful for.  Take over-the-counter and prescription medicines only as told by your health care provider.  Let your health care provider know if you have any concerns.  Keep all postpartum visits. This is important. Contact a health care provider if:  Your baby blues do not go away after 2 weeks. Get help right away if:  You have thoughts of taking your own life (suicidal thoughts), or of harming your baby or someone else.  You see or hear things that are not there (hallucinations). If you ever feel like you may hurt yourself or others, or have thoughts about taking your own life, get help right away. Go to your nearest emergency department or:  Call your local emergency services (911 in the [place]  Call a suicide crisis helpline, such as the National Suicide Prevention Lifeline, at 1-800-273-8255. This is open 24 hours a day in the [place]  Text the Crisis Text Line at 741741 (in the [place] Summary  After giving birth, you may feel happy one minute and sad or stressed the next. Feelings of sadness that happen right after the baby is born and go away after a week or two are called the baby blues.  You can manage the baby blues by getting enough rest, eating a healthy diet, exercising, spending time with supportive people, and finding ways to manage stress.  If feelings of sadness and stress last longer than 2 weeks or get in the way of caring for your baby, talk with your health care provider. This may mean you have postpartum depression. This information is not intended to replace advice given to you by your health care provider. Make sure you discuss any questions you have with your health care provider. Document Revised: 06/08/2020 Document Reviewed: 06/08/2020 Else[employer] Patient Education  2021 [employer]  

## 2021-04-11 NOTE — Progress Notes (Signed)
Mother discharged.  Discharge instructions given.  Mother verbalizes understanding.  Transported by auxiliary.  

## 2024-11-09 ENCOUNTER — Other Ambulatory Visit: Payer: Self-pay | Admitting: Family Medicine

## 2024-11-09 DIAGNOSIS — Z1231 Encounter for screening mammogram for malignant neoplasm of breast: Secondary | ICD-10-CM

## 2024-11-29 ENCOUNTER — Ambulatory Visit
Admission: RE | Admit: 2024-11-29 | Discharge: 2024-11-29 | Disposition: A | Payer: Self-pay | Source: Ambulatory Visit | Attending: Family Medicine | Admitting: Family Medicine

## 2024-11-29 DIAGNOSIS — Z1231 Encounter for screening mammogram for malignant neoplasm of breast: Secondary | ICD-10-CM | POA: Diagnosis present
